# Patient Record
Sex: Female | Born: 1970 | Race: Black or African American | Hispanic: No | Marital: Single | Smoking: Never smoker
Health system: Southern US, Community
[De-identification: ages and names within clinical notes are randomized; demographics above are authoritative.]

## PROBLEM LIST (undated history)

## (undated) DIAGNOSIS — D219 Benign neoplasm of connective and other soft tissue, unspecified: Secondary | ICD-10-CM

## (undated) HISTORY — PX: TUBAL LIGATION: SHX77

---

## 2016-04-24 ENCOUNTER — Ambulatory Visit (HOSPITAL_COMMUNITY)
Admission: EM | Admit: 2016-04-24 | Discharge: 2016-04-24 | Disposition: A | Discharge status: Home / Self Care | Payer: Medicaid Other | Attending: Family Medicine | Admitting: Family Medicine

## 2016-04-24 ENCOUNTER — Encounter (HOSPITAL_COMMUNITY): Payer: Self-pay | Admitting: Emergency Medicine

## 2016-04-24 DIAGNOSIS — B86 Scabies: Secondary | ICD-10-CM | POA: Diagnosis not present

## 2016-04-24 DIAGNOSIS — K589 Irritable bowel syndrome without diarrhea: Secondary | ICD-10-CM | POA: Diagnosis not present

## 2016-04-24 LAB — POCT PREGNANCY, URINE: Preg Test, Ur: NEGATIVE

## 2016-04-24 MED ORDER — DICYCLOMINE HCL 20 MG PO TABS: 20.0000 mg | ORAL_TABLET | Freq: Two times a day (BID) | ORAL | 0 refills | Status: DC

## 2016-04-24 MED ORDER — IVERMECTIN 3 MG PO TABS: ORAL_TABLET | ORAL | 0 refills | Status: DC

## 2016-04-24 NOTE — ED Provider Notes (Signed)
CSN: [id number]     Arrival date & time 04/24/16  1214 History   First MD Initiated Contact with Patient 04/24/16 1438     Chief Complaint  Patient presents with  . Possible Pregnancy  . Head Lice   (Consider location/radiation/quality/duration/timing/severity/associated sxs/prior Treatment) Patient c/o having some bowel spasms or feeling like she did when was pregnant and baby was kicking her.  Patient also c/o being exposed to scabies and lice and feeling something moving in her hair.   The history is provided by the patient. The history is limited by a language barrier.  Possible Pregnancy  This is a new problem. The current episode started yesterday. The problem occurs rarely. The problem has not changed since onset.Associated symptoms include abdominal pain. Nothing aggravates the symptoms. Nothing relieves the symptoms. She has tried nothing for the symptoms.    History reviewed. No pertinent past medical history. Past Surgical History:  Procedure Laterality Date  . CESAREAN SECTION     Family History  Problem Relation Age of Onset  . Hypertension Mother    Social History  Substance Use Topics  . Smoking status: Never Smoker  . Smokeless tobacco: Never Used  . Alcohol use Yes   OB History    No data available     Review of Systems  Constitutional: Negative.   HENT: Negative.   Eyes: Negative.   Respiratory: Negative.   Cardiovascular: Negative.   Gastrointestinal: Positive for abdominal pain.  Endocrine: Negative.   Genitourinary: Negative.   Musculoskeletal: Negative.   Skin: Negative.   Allergic/Immunologic: Negative.   Neurological: Negative.   Hematological: Negative.   Psychiatric/Behavioral: Negative.     Allergies  Review of patient's allergies indicates no known allergies.  Home Medications   Prior to Admission medications   Not on File   Meds Ordered and Administered this Visit  Medications - No data to display  LMP 03/15/2016  No data  found.   Physical Exam  Constitutional: She is oriented to person, place, and time. She appears well-developed and well-nourished.  HENT:  Head: Normocephalic and atraumatic.  Right Ear: External ear normal.  Left Ear: External ear normal.  Mouth/Throat: Oropharynx is clear and moist.  Eyes: Conjunctivae and EOM are normal. Pupils are equal, round, and reactive to light.  Neck: Normal range of motion. Neck supple.  Cardiovascular: Normal rate, regular rhythm and normal heart sounds.   Pulmonary/Chest: Effort normal and breath sounds normal.  Abdominal: Soft. Bowel sounds are normal.  Musculoskeletal: Normal range of motion.  Neurological: She is alert and oriented to person, place, and time.  Skin: Skin is warm and dry.  Psychiatric: She has a normal mood and affect.  Nursing note and vitals reviewed.   Urgent Care Course   Clinical Course    Procedures (including critical care time)  Labs Review Labs Reviewed  POCT PREGNANCY, URINE    Imaging Review No results found.   Visual Acuity Review  Right Eye Distance:   Left Eye Distance:   Bilateral Distance:    Right Eye Near:   Left Eye Near:    Bilateral Near:         MDM  Scabies and lice exposure - ivermectin 3mg  3 po x 1day #3  Bowel spasm - Bentyl 20mg  po bid #20     Lysbeth Penner, FNP 04/24/16 1516

## 2016-04-24 NOTE — ED Notes (Signed)
On discharge patient requested another prescription and requested a work note

## 2016-04-24 NOTE — ED Triage Notes (Signed)
Patient has multiple complaints.  Patient wants a pregnancy test.  Last period was September 1 and is reported as "light". Patient also "feels something moving around"    Patient also mentions feeling things crawling on scalp.  Patient reports she has been in environments where lice and scabies are a problem

## 2016-07-25 ENCOUNTER — Encounter (HOSPITAL_COMMUNITY): Payer: Self-pay | Admitting: Emergency Medicine

## 2016-07-25 ENCOUNTER — Ambulatory Visit (HOSPITAL_COMMUNITY)
Admission: EM | Admit: 2016-07-25 | Discharge: 2016-07-25 | Disposition: A | Discharge status: Home / Self Care | Payer: Medicaid Other | Attending: Emergency Medicine | Admitting: Emergency Medicine

## 2016-07-25 DIAGNOSIS — B86 Scabies: Secondary | ICD-10-CM

## 2016-07-25 MED ORDER — PERMETHRIN 5 % EX CREA: TOPICAL_CREAM | CUTANEOUS | 2 refills | Status: DC

## 2016-07-25 MED ORDER — IVERMECTIN 3 MG PO TABS: ORAL_TABLET | ORAL | 1 refills | Status: DC

## 2016-07-25 NOTE — ED Provider Notes (Signed)
CSN: [id number]     Arrival date & time 07/25/16  1055 History   None    Chief Complaint  Patient presents with  . Rash   (Consider location/radiation/quality/duration/timing/severity/associated sxs/prior Treatment) Patient is here for c/o scabies.     The history is provided by the patient.  Rash  Location:  Full body Onset quality:  Gradual Duration:  2 days Timing:  Constant Chronicity:  New Relieved by:  Nothing Worsened by:  Nothing   History reviewed. No pertinent past medical history. Past Surgical History:  Procedure Laterality Date  . CESAREAN SECTION     Family History  Problem Relation Age of Onset  . Hypertension Mother    Social History  Substance Use Topics  . Smoking status: Never Smoker  . Smokeless tobacco: Never Used  . Alcohol use Yes   OB History    No data available     Review of Systems  Constitutional: Negative.   HENT: Negative.   Eyes: Negative.   Respiratory: Negative.   Cardiovascular: Negative.   Endocrine: Negative.   Genitourinary: Negative.   Musculoskeletal: Negative.   Skin: Positive for rash.  Allergic/Immunologic: Negative.   Hematological: Negative.   Psychiatric/Behavioral: Negative.     Allergies  Patient has no known allergies.  Home Medications   Prior to Admission medications   Medication Sig Start Date End Date Taking? Authorizing Provider  dicyclomine (BENTYL) 20 MG tablet Take 1 tablet (20 mg total) by mouth 2 (two) times daily. 04/24/16   Lysbeth Penner, FNP  ivermectin (STROMECTOL) 3 MG TABS tablet Take 3 po x 1day 07/25/16   Lysbeth Penner, FNP   Meds Ordered and Administered this Visit  Medications - No data to display  BP 106/70 (BP Location: Left Arm)   Pulse 85   Temp 98.2 F (36.8 C) (Oral)   Resp 18   LMP 07/18/2016   SpO2 100%  No data found.   Physical Exam  Constitutional: She appears well-developed and well-nourished.  HENT:  Head: Normocephalic and atraumatic.  Right Ear:  External ear normal.  Left Ear: External ear normal.  Mouth/Throat: Oropharynx is clear and moist.  Eyes: Conjunctivae and EOM are normal. Pupils are equal, round, and reactive to light.  Neck: Normal range of motion. Neck supple.  Cardiovascular: Normal rate, regular rhythm and normal heart sounds.   Pulmonary/Chest: Effort normal and breath sounds normal.  Abdominal: Soft. Bowel sounds are normal.  Nursing note and vitals reviewed.   Urgent Care Course   Clinical Course     Procedures (including critical care time)  Labs Review Labs Reviewed - No data to display  Imaging Review No results found.   Visual Acuity Review  Right Eye Distance:   Left Eye Distance:   Bilateral Distance:    Right Eye Near:   Left Eye Near:    Bilateral Near:         MDM   1. Scabies    Ivermectin  Permethrin cream      Lysbeth Penner, FNP 07/25/16 [place], FNP 07/25/16 1225

## 2016-07-25 NOTE — ED Triage Notes (Signed)
Here for persistent rash/scabies all over body onset 1 month  Seen here in 10/17 and was treated for scabies  Denies fevers, chills  A&O x4... NAD

## 2016-08-28 ENCOUNTER — Encounter (HOSPITAL_COMMUNITY): Payer: Self-pay | Admitting: Emergency Medicine

## 2016-08-28 ENCOUNTER — Ambulatory Visit (HOSPITAL_COMMUNITY)
Admission: EM | Admit: 2016-08-28 | Discharge: 2016-08-28 | Disposition: A | Discharge status: Home / Self Care | Payer: Medicaid Other | Attending: Internal Medicine | Admitting: Internal Medicine

## 2016-08-28 DIAGNOSIS — H9201 Otalgia, right ear: Secondary | ICD-10-CM | POA: Diagnosis not present

## 2016-08-28 DIAGNOSIS — H6501 Acute serous otitis media, right ear: Secondary | ICD-10-CM | POA: Diagnosis not present

## 2016-08-28 DIAGNOSIS — L309 Dermatitis, unspecified: Secondary | ICD-10-CM

## 2016-08-28 DIAGNOSIS — Z8619 Personal history of other infectious and parasitic diseases: Secondary | ICD-10-CM

## 2016-08-28 LAB — POCT PREGNANCY, URINE: Preg Test, Ur: NEGATIVE

## 2016-08-28 MED ORDER — FLUOCINOLONE ACETONIDE BODY 0.01 % EX OIL: TOPICAL_OIL | CUTANEOUS | 0 refills | Status: AC

## 2016-08-28 MED ORDER — AMOXICILLIN 500 MG PO CAPS: 1000.0000 mg | ORAL_CAPSULE | Freq: Two times a day (BID) | ORAL | 0 refills | Status: DC

## 2016-08-28 NOTE — ED Provider Notes (Signed)
CSN: [id number]     Arrival date & time 08/28/16  26 History   First MD Initiated Contact with Patient 08/28/16 1141     Chief Complaint  Patient presents with  . Otalgia   (Consider location/radiation/quality/duration/timing/severity/associated sxs/prior Treatment) 46 year old female complaining of right earache. This started just a few days ago. She states she feels scabies crawling in her ear. 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 She is requesting oral medication for this. He states that the right earache his chief complaint. When asked to show areas of infestation or rash from scabies she is unable to do so. Searching the skin, eyebrows and scalp there is no evidence of infection of firm and of any kind. No rash. She is also requesting Derma-Smoothe for her dry skin. She states that her PCP in [place] is no longer in practice.      History reviewed. No pertinent past medical history. Past Surgical History:  Procedure Laterality Date  . CESAREAN SECTION     Family History  Problem Relation Age of Onset  . Hypertension Mother    Social History  Substance Use Topics  . Smoking status: Never Smoker  . Smokeless tobacco: Never Used  . Alcohol use Yes   OB History    No data available     Review of Systems  HENT: Positive for ear pain.   Eyes: Negative.   Respiratory: Negative.   Gastrointestinal: Negative.   Genitourinary: Negative.   Skin: Negative for rash.  Neurological: Negative.   All other systems reviewed and are negative.   Allergies  Patient has no known allergies.  Home Medications   Prior to Admission medications   Medication Sig Start Date End Date Taking? Authorizing Provider  amoxicillin (AMOXIL) 500 MG capsule Take 2 capsules (1,000 mg total) by mouth 2 (two) times daily. 08/28/16   Janne Napoleon, NP  Fluocinolone Acetonide Body  (DERMA-SMOOTHE/FS BODY) 0.01 % OIL Use once daily as directed 08/28/16   Janne Napoleon, NP   Meds Ordered and Administered this Visit  Medications - No data to display  BP (!) 115/54 (BP Location: Right Arm)   Pulse 68   Temp 97.8 F (36.6 C) (Oral)   Resp 16   SpO2 100%  No data found.   Physical Exam  Constitutional: She is oriented to person, place, and time. She appears well-developed and well-nourished. No distress.  HENT:  Head: Normocephalic and atraumatic.  Mouth/Throat: No oropharyngeal exudate.  Superficial minor crusting to the outer ear. Note the patient states this is normal for her and chronic. Examination of the EAC reveals no skin lesions, burrowing, Berman or other pathology. The EAC is clear. The TM does have some bulging and opaque fluid in the middle ear.  Eyes: EOM are normal.  Neck: Normal range of motion. Neck supple.  Cardiovascular: Normal rate.   Pulmonary/Chest: Effort normal. No respiratory distress.  Musculoskeletal: She exhibits no edema.  Neurological: She is alert and oriented to person, place, and time. She exhibits normal muscle tone.  Skin: Skin is warm and dry. No rash noted. She is not diaphoretic.  Psychiatric: She has a normal mood and affect.  Nursing note and vitals reviewed.   Urgent Care Course     Procedures (including critical care time)  Labs Review Labs Reviewed  POCT PREGNANCY, URINE    Imaging Review No results found.  Visual Acuity Review  Right Eye Distance:   Left Eye Distance:   Bilateral Distance:    Right Eye Near:   Left Eye Near:    Bilateral Near:         MDM   1. Right acute serous otitis media, recurrence not specified   2. Right ear pain   3. History of scabies   4. Dermatitis    Take the medication as directed. On the  first stage there is listed a resource to call to obtain a primary care doctor. Written in your instructions and told [facility] there are 2 telephone numbers to call they can  help you obtain a primary care provider. Meds ordered this encounter  Medications  . Fluocinolone Acetonide Body (DERMA-SMOOTHE/FS BODY) 0.01 % OIL    Sig: Use once daily as directed    Dispense:  120 mL    Refill:  0    Order Specific Question:   Supervising Provider    Answer:   Sherlene Shams [id number]  . amoxicillin (AMOXIL) 500 MG capsule    Sig: Take 2 capsules (1,000 mg total) by mouth 2 (two) times daily.    Dispense:  40 capsule    Refill:  0    Order Specific Question:   Supervising Provider    Answer:   Sherlene Shams [id number]       Janne Napoleon, NP 08/28/16 1209

## 2016-08-28 NOTE — ED Triage Notes (Signed)
The patient presented to the [facility] with a complaint of right ear pain x 2 days.

## 2016-08-28 NOTE — Discharge Instructions (Signed)
Take the medication as directed. On the  first stage there is listed a resource to call to obtain a primary care doctor. Written in your instructions and told [facility] [place] there are 2 telephone numbers to call they can help you obtain a primary care provider.

## 2016-08-28 NOTE — ED Notes (Signed)
Patient requested pregnancy test, Jacqueline mabe, np agreeable to order.  Specimen in lab

## 2016-09-13 ENCOUNTER — Ambulatory Visit (HOSPITAL_COMMUNITY)
Admission: EM | Admit: 2016-09-13 | Discharge: 2016-09-13 | Disposition: A | Discharge status: Home / Self Care | Payer: Medicaid Other | Attending: Family Medicine | Admitting: Family Medicine

## 2016-09-13 ENCOUNTER — Encounter (HOSPITAL_COMMUNITY): Payer: Self-pay | Admitting: Emergency Medicine

## 2016-09-13 DIAGNOSIS — R05 Cough: Secondary | ICD-10-CM | POA: Diagnosis not present

## 2016-09-13 DIAGNOSIS — R059 Cough, unspecified: Secondary | ICD-10-CM

## 2016-09-13 DIAGNOSIS — J40 Bronchitis, not specified as acute or chronic: Secondary | ICD-10-CM

## 2016-09-13 MED ORDER — BENZONATATE 100 MG PO CAPS: 100.0000 mg | ORAL_CAPSULE | Freq: Three times a day (TID) | ORAL | 0 refills | Status: DC

## 2016-09-13 MED ORDER — IPRATROPIUM BROMIDE 0.06 % NA SOLN: 2.0000 | Freq: Four times a day (QID) | NASAL | 0 refills | Status: AC

## 2016-09-13 MED ORDER — AZITHROMYCIN 250 MG PO TABS: 250.0000 mg | ORAL_TABLET | Freq: Every day | ORAL | 0 refills | Status: DC

## 2016-09-13 NOTE — ED Notes (Signed)
Called x1. No answer.

## 2016-09-13 NOTE — ED Triage Notes (Signed)
Pt c/o cold sx onset: 2 days  Sx include: dry cough, nasal drainage, SOB, fevers  A&O x4... NAD

## 2016-09-13 NOTE — ED Provider Notes (Signed)
CSN: [id number]     Arrival date & time 09/13/16  1318 History   None    Chief Complaint  Patient presents with  . URI   (Consider location/radiation/quality/duration/timing/severity/associated sxs/prior Treatment) Patient c/o cough and uri sx's for a week.   The history is provided by the patient.  URI  Presenting symptoms: congestion, cough, fatigue and rhinorrhea   Severity:  Moderate Onset quality:  Sudden Duration:  1 week Timing:  Constant Progression:  Worsening Chronicity:  New Relieved by:  Nothing Worsened by:  Nothing Ineffective treatments:  None tried   History reviewed. No pertinent past medical history. Past Surgical History:  Procedure Laterality Date  . CESAREAN SECTION     Family History  Problem Relation Age of Onset  . Hypertension Mother    Social History  Substance Use Topics  . Smoking status: Never Smoker  . Smokeless tobacco: Never Used  . Alcohol use Yes   OB History    No data available     Review of Systems  Constitutional: Positive for fatigue.  HENT: Positive for congestion and rhinorrhea.   Eyes: Negative.   Respiratory: Positive for cough.   Cardiovascular: Negative.   Gastrointestinal: Negative.   Endocrine: Negative.   Genitourinary: Negative.   Musculoskeletal: Negative.   Allergic/Immunologic: Negative.   Neurological: Negative.   Hematological: Negative.   Psychiatric/Behavioral: Negative.     Allergies  Patient has no known allergies.  Home Medications   Prior to Admission medications   Medication Sig Start Date End Date Taking? Authorizing Provider  amoxicillin (AMOXIL) 500 MG capsule Take 2 capsules (1,000 mg total) by mouth 2 (two) times daily. 08/28/16   Janne Napoleon, NP  azithromycin (ZITHROMAX) 250 MG tablet Take 1 tablet (250 mg total) by mouth daily. Take first 2 tablets together, then 1 every day until finished. 09/13/16   Lysbeth Penner, FNP  benzonatate (TESSALON) 100 MG capsule Take 1 capsule (100 mg  total) by mouth every 8 (eight) hours. 09/13/16   Lysbeth Penner, FNP  Fluocinolone Acetonide Body (DERMA-SMOOTHE/FS BODY) 0.01 % OIL Use once daily as directed 08/28/16   Janne Napoleon, NP  ipratropium (ATROVENT) 0.06 % nasal spray Place 2 sprays into both nostrils 4 (four) times daily. 09/13/16   Lysbeth Penner, FNP   Meds Ordered and Administered this Visit  Medications - No data to display  BP 117/81 (BP Location: Left Arm)   Pulse 68   Temp 97.5 F (36.4 C) (Oral)   Resp 20   LMP 09/13/2016   SpO2 100%  No data found.   Physical Exam  Constitutional: She appears well-developed and well-nourished.  HENT:  Head: Normocephalic and atraumatic.  Right Ear: External ear normal.  Left Ear: External ear normal.  Mouth/Throat: Oropharynx is clear and moist.  Eyes: Conjunctivae and EOM are normal. Pupils are equal, round, and reactive to light.  Neck: Normal range of motion. Neck supple.  Cardiovascular: Normal rate, regular rhythm and normal heart sounds.   Pulmonary/Chest: Effort normal and breath sounds normal.  Abdominal: Soft. Bowel sounds are normal.  Nursing note and vitals reviewed.   Urgent Care Course     Procedures (including critical care time)  Labs Review Labs Reviewed - No data to display  Imaging Review No results found.   Visual Acuity Review  Right Eye Distance:   Left Eye Distance:   Bilateral Distance:    Right Eye Near:   Left Eye Near:    Bilateral Near:  MDM   1. Bronchitis   2. Cough    Zpak as directed Tessalon perles Atrovent Nasal Spray  Push po fluids, rest, tylenol and motrin otc prn as directed for fever, arthralgias, and myalgias.  Follow up prn if sx's continue or persist.    Lysbeth Penner, FNP 09/13/16 1408

## 2016-11-12 ENCOUNTER — Encounter (HOSPITAL_COMMUNITY): Payer: Self-pay | Admitting: Emergency Medicine

## 2016-11-12 ENCOUNTER — Emergency Department (HOSPITAL_COMMUNITY)
Admission: EM | Admit: 2016-11-12 | Discharge: 2016-11-13 | Disposition: A | Discharge status: Home / Self Care | Payer: Medicaid Other | Attending: Emergency Medicine | Admitting: Emergency Medicine

## 2016-11-12 ENCOUNTER — Emergency Department (HOSPITAL_COMMUNITY): Payer: Medicaid Other

## 2016-11-12 DIAGNOSIS — R0789 Other chest pain: Secondary | ICD-10-CM | POA: Insufficient documentation

## 2016-11-12 DIAGNOSIS — K2970 Gastritis, unspecified, without bleeding: Secondary | ICD-10-CM | POA: Diagnosis not present

## 2016-11-12 DIAGNOSIS — R079 Chest pain, unspecified: Secondary | ICD-10-CM

## 2016-11-12 LAB — I-STAT TROPONIN, ED: TROPONIN I, POC: 0.01 ng/mL (ref 0.00–0.08)

## 2016-11-12 LAB — CBC
HEMATOCRIT: 32.5 % — AB (ref 36.0–46.0)
Hemoglobin: 10.2 g/dL — ABNORMAL LOW (ref 12.0–15.0)
MCH: 25.4 pg — ABNORMAL LOW (ref 26.0–34.0)
MCHC: 31.4 g/dL (ref 30.0–36.0)
MCV: 80.8 fL (ref 78.0–100.0)
PLATELETS: 288 10*3/uL (ref 150–400)
RBC: 4.02 MIL/uL (ref 3.87–5.11)
RDW: 15.4 % (ref 11.5–15.5)
WBC: 5.4 10*3/uL (ref 4.0–10.5)

## 2016-11-12 LAB — LIPASE, BLOOD: LIPASE: 25 U/L (ref 11–51)

## 2016-11-12 MED ORDER — KETOROLAC TROMETHAMINE 30 MG/ML IJ SOLN: 30.0000 mg | Freq: Once | INTRAMUSCULAR | Status: DC

## 2016-11-12 NOTE — ED Provider Notes (Signed)
[facility] DEPT Provider Note   CSN: [id number] Arrival date & time: 11/12/16  2213     History   Chief Complaint Chief Complaint  Patient presents with  . L Ribcage pain  . Heart flutter    HPI Jacqueline Richards is a 46 y.o. female.  HPI 46 y.o. female, presents to the Emergency Department today complaining of left sided chest discomfort x 1 hour ago. Pt states it feels like a pressure sensation underneath her left rib cage. Notes pain 7/10. No nausea without emesis. No shortness of breath. No diaphoresis. No pain with inspiration. Notes ability to tolerate PO without difficulty, but causes some discomfort. Notes no meds PTA. No hx ACS. No FH. No Hx DVT/PE. No recent travel. No recent surgeries. No other symptoms noted.   History reviewed. No pertinent past medical history.  There are no active problems to display for this patient.   Past Surgical History:  Procedure Laterality Date  . CESAREAN SECTION      OB History    No data available       Home Medications    Prior to Admission medications   Medication Sig Start Date End Date Taking? Authorizing Provider  amoxicillin (AMOXIL) 500 MG capsule Take 2 capsules (1,000 mg total) by mouth 2 (two) times daily. 08/28/16   Janne Napoleon, NP  azithromycin (ZITHROMAX) 250 MG tablet Take 1 tablet (250 mg total) by mouth daily. Take first 2 tablets together, then 1 every day until finished. 09/13/16   Lysbeth Penner, FNP  benzonatate (TESSALON) 100 MG capsule Take 1 capsule (100 mg total) by mouth every 8 (eight) hours. 09/13/16   Lysbeth Penner, FNP  Fluocinolone Acetonide Body (DERMA-SMOOTHE/FS BODY) 0.01 % OIL Use once daily as directed 08/28/16   Janne Napoleon, NP  ipratropium (ATROVENT) 0.06 % nasal spray Place 2 sprays into both nostrils 4 (four) times daily. 09/13/16   Lysbeth Penner, FNP    Family History Family History  Problem Relation Age of Onset  . Hypertension Mother     Social History Social History    Substance Use Topics  . Smoking status: Never Smoker  . Smokeless tobacco: Never Used  . Alcohol use Yes     Allergies   Patient has no known allergies.   Review of Systems Review of Systems ROS reviewed and all are negative for acute change except as noted in the HPI.  Physical Exam Updated Vital Signs BP 121/71   Pulse 79   Temp 98.7 F (37.1 C) (Oral)   Ht 5\' 2"  (1.575 m)   Wt 59 kg   LMP 11/08/2016 (Approximate)   SpO2 100%   BMI 23.78 kg/m   Physical Exam  Constitutional: She is oriented to person, place, and time. Vital signs are normal. She appears well-developed and well-nourished.  HENT:  Head: Normocephalic and atraumatic.  Right Ear: Hearing normal.  Left Ear: Hearing normal.  Eyes: Conjunctivae and EOM are normal. Pupils are equal, round, and reactive to light.  Neck: Normal range of motion. Neck supple.  Cardiovascular: Normal rate, regular rhythm, normal heart sounds and intact distal pulses.   Pulmonary/Chest: Effort normal and breath sounds normal. No respiratory distress. She has no wheezes. She has no rales.  Abdominal: Soft. Normal appearance and bowel sounds are normal. There is tenderness in the left upper quadrant.  Musculoskeletal: Normal range of motion.  Neurological: She is alert and oriented to person, place, and time.  Skin: Skin is warm and dry.  Psychiatric: She has a normal mood and affect. Her speech is normal and behavior is normal. Thought content normal.  Nursing note and vitals reviewed.  ED Treatments / Results  Labs (all labs ordered are listed, but only abnormal results are displayed) Labs Reviewed  CBC - Abnormal; Notable for the following:       Result Value   Hemoglobin 10.2 (*)    HCT 32.5 (*)    MCH 25.4 (*)    All other components within normal limits  COMPREHENSIVE METABOLIC PANEL - Abnormal; Notable for the following:    Sodium 132 (*)    ALT 8 (*)    Total Bilirubin 0.2 (*)    Anion gap 4 (*)    All other  components within normal limits  LIPASE, BLOOD  I-STAT TROPOININ, ED    EKG  EKG Interpretation None       Radiology Dg Chest 2 View  Result Date: 11/12/2016 CLINICAL DATA:  Dyspnea, cough x2 weeks and palpitations EXAM: CHEST  2 VIEW COMPARISON:  None. FINDINGS: The heart size and mediastinal contours are within normal limits. Both lungs are clear. The visualized skeletal structures are unremarkable. IMPRESSION: No active cardiopulmonary disease. Electronically Signed   By: Ashley Royalty M.D.   On: 11/12/2016 23:53    Procedures Procedures (including critical care time)  Medications Ordered in ED Medications  ketorolac (TORADOL) 30 MG/ML injection 30 mg (not administered)     Initial Impression / Assessment and Plan / ED Course  I have reviewed the triage vital signs and the nursing notes.  Pertinent labs & imaging results that were available during my care of the patient were reviewed by me and considered in my medical decision making (see chart for details).  Final Clinical Impressions(s) / ED Diagnoses  {I have reviewed and evaluated the relevant laboratory values. {I have reviewed and evaluated the relevant imaging studies. {I have interpreted the relevant EKG. {I have reviewed the relevant previous healthcare records.  {I obtained HPI from historian.   ED Course:  Assessment: Pt is a 46 y.o. female presents with CP PTA. Notes nausea. No SOB. No emesis. NO diaphoresis. Notes pain underneath left rib cage in LUQ. Worsens with PO intake and certain positions. Risk Factors for ACS: None. Given Toradol in ED. Patient is to be discharged with recommendation to follow up with PCP in regards to today's hospital visit. Chest pain is not likely of cardiac or pulmonary etiology d/t presentation, perc negative, VSS, no tracheal deviation, no JVD or new murmur, RRR, breath sounds equal bilaterally, EKG without acute abnormalities, negative troponin, and negative CXR. Heart Score 0. Pt has  been advised start a PPI and return to the ED is CP becomes exertional, associated with diaphoresis or nausea, radiates to left jaw/arm, worsens or becomes concerning in any way. Pt appears reliable for follow up and is agreeable to discharge. Patient is in no acute distress. Vital Signs are stable. Patient is able to ambulate. Patient able to tolerate PO.   Disposition/Plan:  DC Home Additional Verbal discharge instructions given and discussed with patient.  Pt Instructed to f/u with PCP in the next week for evaluation and treatment of symptoms. Return precautions given Pt acknowledges and agrees with plan  Supervising Physician Duffy Bruce, MD  Final diagnoses:  Chest pain, unspecified type  Gastritis without bleeding, unspecified chronicity, unspecified gastritis type    New Prescriptions New Prescriptions   No medications on file     Shary Decamp, PA-C  11/13/16 0052    April Palumbo, MD 11/13/16 0300

## 2016-11-13 LAB — COMPREHENSIVE METABOLIC PANEL
ALT: 8 U/L — AB (ref 14–54)
ANION GAP: 4 — AB (ref 5–15)
AST: 16 U/L (ref 15–41)
Albumin: 3.5 g/dL (ref 3.5–5.0)
Alkaline Phosphatase: 47 U/L (ref 38–126)
BUN: 10 mg/dL (ref 6–20)
CHLORIDE: 103 mmol/L (ref 101–111)
CO2: 25 mmol/L (ref 22–32)
CREATININE: 0.79 mg/dL (ref 0.44–1.00)
Calcium: 10 mg/dL (ref 8.9–10.3)
Glucose, Bld: 94 mg/dL (ref 65–99)
POTASSIUM: 3.5 mmol/L (ref 3.5–5.1)
SODIUM: 132 mmol/L — AB (ref 135–145)
Total Bilirubin: 0.2 mg/dL — ABNORMAL LOW (ref 0.3–1.2)
Total Protein: 6.8 g/dL (ref 6.5–8.1)

## 2016-11-13 MED ORDER — PANTOPRAZOLE SODIUM 20 MG PO TBEC: 20.0000 mg | DELAYED_RELEASE_TABLET | Freq: Every day | ORAL | 0 refills | Status: AC

## 2016-11-13 MED ORDER — GI COCKTAIL ~~LOC~~
30.0000 mL | Freq: Once | ORAL | Status: AC
Start: 2016-11-13 — End: 2016-11-13
  Administered 2016-11-13: 30 mL via ORAL
  Filled 2016-11-13: qty 30

## 2016-11-13 MED ORDER — IBUPROFEN 400 MG PO TABS
600.0000 mg | ORAL_TABLET | Freq: Once | ORAL | Status: AC
  Administered 2016-11-13: 600 mg via ORAL
  Filled 2016-11-13: qty 1

## 2016-11-13 NOTE — ED Notes (Signed)
Pt given Sprite and sandwich per EDP.

## 2016-11-13 NOTE — ED Notes (Signed)
E-signature not available. Pt states that she understands dc instructions.

## 2016-11-13 NOTE — Discharge Instructions (Signed)
Please read and follow all provided instructions.  Your diagnoses today include:  1. Chest pain, unspecified type   2. Gastritis without bleeding, unspecified chronicity, unspecified gastritis type     Tests performed today include: An EKG of your heart A chest x-ray Cardiac enzymes - a blood test for heart muscle damage Blood counts and electrolytes Vital signs. See below for your results today.   Medications prescribed:   Take any prescribed medications only as directed.  Follow-up instructions: Please follow-up with your primary care provider as soon as you can for further evaluation of your symptoms.   Return instructions:  SEEK IMMEDIATE MEDICAL ATTENTION IF: You have severe chest pain, especially if the pain is crushing or pressure-like and spreads to the arms, back, neck, or jaw, or if you have sweating, nausea (feeling sick to your stomach), or shortness of breath. THIS IS AN EMERGENCY. Don't wait to see if the pain will go away. Get medical help at once. Call 911 or 0 (operator). DO NOT drive yourself to the hospital.  Your chest pain gets worse and does not go away with rest.  You have an attack of chest pain lasting longer than usual, despite rest and treatment with the medications your caregiver has prescribed.  You wake from sleep with chest pain or shortness of breath. You feel dizzy or faint. You have chest pain not typical of your usual pain for which you originally saw your caregiver.  You have any other emergent concerns regarding your health.  Additional Information: Chest pain comes from many different causes. Your caregiver has diagnosed you as having chest pain that is not specific for one problem, but does not require admission.  You are at low risk for an acute heart condition or other serious illness.   Your vital signs today were: BP 129/79    Pulse 77    Temp 98.7 F (37.1 C) (Oral)    Resp 14    Ht 5\' 2"  (1.575 m)    Wt 59 kg    LMP 11/08/2016  (Approximate)    SpO2 100%    BMI 23.78 kg/m  If your blood pressure (BP) was elevated above 135/85 this visit, please have this repeated by your doctor within one month. --------------

## 2017-03-23 ENCOUNTER — Encounter (HOSPITAL_COMMUNITY): Payer: Self-pay | Admitting: Emergency Medicine

## 2017-03-23 DIAGNOSIS — Z79899 Other long term (current) drug therapy: Secondary | ICD-10-CM | POA: Insufficient documentation

## 2017-03-23 DIAGNOSIS — N939 Abnormal uterine and vaginal bleeding, unspecified: Secondary | ICD-10-CM | POA: Insufficient documentation

## 2017-03-23 DIAGNOSIS — R102 Pelvic and perineal pain: Secondary | ICD-10-CM | POA: Diagnosis present

## 2017-03-23 LAB — COMPREHENSIVE METABOLIC PANEL
ALT: 7 U/L — ABNORMAL LOW (ref 14–54)
ANION GAP: 6 (ref 5–15)
AST: 16 U/L (ref 15–41)
Albumin: 3.4 g/dL — ABNORMAL LOW (ref 3.5–5.0)
Alkaline Phosphatase: 55 U/L (ref 38–126)
BILIRUBIN TOTAL: 0.4 mg/dL (ref 0.3–1.2)
BUN: 7 mg/dL (ref 6–20)
CO2: 22 mmol/L (ref 22–32)
Calcium: 9.6 mg/dL (ref 8.9–10.3)
Chloride: 110 mmol/L (ref 101–111)
Creatinine, Ser: 0.77 mg/dL (ref 0.44–1.00)
GLUCOSE: 107 mg/dL — AB (ref 65–99)
POTASSIUM: 3.6 mmol/L (ref 3.5–5.1)
Sodium: 138 mmol/L (ref 135–145)
Total Protein: 7.3 g/dL (ref 6.5–8.1)

## 2017-03-23 LAB — URINALYSIS, ROUTINE W REFLEX MICROSCOPIC
BACTERIA UA: NONE SEEN
BILIRUBIN URINE: NEGATIVE
Glucose, UA: NEGATIVE mg/dL
Ketones, ur: NEGATIVE mg/dL
Nitrite: NEGATIVE
Protein, ur: 100 mg/dL — AB
SPECIFIC GRAVITY, URINE: 1.018 (ref 1.005–1.030)
pH: 5 (ref 5.0–8.0)

## 2017-03-23 LAB — CBC
HEMATOCRIT: 32.6 % — AB (ref 36.0–46.0)
Hemoglobin: 10.2 g/dL — ABNORMAL LOW (ref 12.0–15.0)
MCH: 24.5 pg — ABNORMAL LOW (ref 26.0–34.0)
MCHC: 31.3 g/dL (ref 30.0–36.0)
MCV: 78.4 fL (ref 78.0–100.0)
Platelets: 303 10*3/uL (ref 150–400)
RBC: 4.16 MIL/uL (ref 3.87–5.11)
RDW: 15.3 % (ref 11.5–15.5)
WBC: 4.7 10*3/uL (ref 4.0–10.5)

## 2017-03-23 LAB — LIPASE, BLOOD: Lipase: 29 U/L (ref 11–51)

## 2017-03-23 NOTE — ED Triage Notes (Signed)
Reports having two tampons in vagina that she can not get out.  Currently on period so unsure about any discharge.  Endorses pelvic pain.  States I think I have toxic shock syndrome.  Tampons in for two days.

## 2017-03-24 ENCOUNTER — Emergency Department (HOSPITAL_COMMUNITY)
Admission: EM | Admit: 2017-03-24 | Discharge: 2017-03-24 | Disposition: A | Discharge status: Home / Self Care | Payer: Medicaid Other | Attending: Emergency Medicine | Admitting: Emergency Medicine

## 2017-03-24 DIAGNOSIS — R102 Pelvic and perineal pain: Secondary | ICD-10-CM

## 2017-03-24 NOTE — ED Provider Notes (Signed)
[facility] DEPT Provider Note   CSN: [id number] Arrival date & time: 03/23/17  2054     History   Chief Complaint Chief Complaint  Patient presents with  . Pelvic Pain    HPI Jacqueline Richards is a 46 y.o. female.  Patient presents to the emergency department for evaluation of pelvic cramping and possible retained tampon. Patient reports that she inserted a tampon Saturday, could not find the string and thought it fell out. She put a second tampon in some time during the day, has gone back to look for the second tampon cannot find the string for that one either. She thinks there might be 2 tampons in her vagina. She is concerned because she had a family member that Shock syndrome. She has not had any fever, nausea, vomiting, rash. She does report pelvic cramping which is unusual with her period.      History reviewed. No pertinent past medical history.  There are no active problems to display for this patient.   Past Surgical History:  Procedure Laterality Date  . CESAREAN SECTION      OB History    No data available       Home Medications    Prior to Admission medications   Medication Sig Start Date End Date Taking? Authorizing Provider  amoxicillin (AMOXIL) 500 MG capsule Take 2 capsules (1,000 mg total) by mouth 2 (two) times daily. 08/28/16   Janne Napoleon, NP  azithromycin (ZITHROMAX) 250 MG tablet Take 1 tablet (250 mg total) by mouth daily. Take first 2 tablets together, then 1 every day until finished. 09/13/16   Lysbeth Penner, FNP  benzonatate (TESSALON) 100 MG capsule Take 1 capsule (100 mg total) by mouth every 8 (eight) hours. 09/13/16   Lysbeth Penner, FNP  Fluocinolone Acetonide Body (DERMA-SMOOTHE/FS BODY) 0.01 % OIL Use once daily as directed 08/28/16   Janne Napoleon, NP  ipratropium (ATROVENT) 0.06 % nasal spray Place 2 sprays into both nostrils 4 (four) times daily. 09/13/16   Lysbeth Penner, FNP  pantoprazole (PROTONIX) 20 MG tablet Take 1 tablet  (20 mg total) by mouth daily. 11/13/16   Shary Decamp, PA-C    Family History Family History  Problem Relation Age of Onset  . Hypertension Mother     Social History Social History  Substance Use Topics  . Smoking status: Never Smoker  . Smokeless tobacco: Never Used  . Alcohol use Yes     Allergies   Patient has no known allergies.   Review of Systems Review of Systems  Genitourinary: Positive for pelvic pain and vaginal bleeding.  All other systems reviewed and are negative.    Physical Exam Updated Vital Signs BP 119/70   Pulse 60   Temp 98.2 F (36.8 C) (Oral)   Resp 18   Ht 5\' 2"  (1.575 m)   Wt 64.9 kg (143 lb)   LMP 03/23/2017 (Exact Date)   SpO2 100%   BMI 26.16 kg/m   Physical Exam  Constitutional: She is oriented to person, place, and time. She appears well-developed and well-nourished. No distress.  HENT:  Head: Normocephalic and atraumatic.  Right Ear: Hearing normal.  Left Ear: Hearing normal.  Nose: Nose normal.  Mouth/Throat: Oropharynx is clear and moist and mucous membranes are normal.  Eyes: Pupils are equal, round, and reactive to light. Conjunctivae and EOM are normal.  Neck: Normal range of motion. Neck supple.  Cardiovascular: Regular rhythm, S1 normal and S2 normal.  Exam reveals no  gallop and no friction rub.   No murmur heard. Pulmonary/Chest: Effort normal and breath sounds normal. No respiratory distress. She exhibits no tenderness.  Abdominal: Soft. Normal appearance and bowel sounds are normal. There is no hepatosplenomegaly. There is no tenderness. There is no rebound, no guarding, no tenderness at McBurney's point and negative Murphy's sign. No hernia.  Genitourinary: Vagina normal and uterus normal. Cervix exhibits no motion tenderness, no discharge and no friability. Right adnexum displays no mass and no tenderness. Left adnexum displays no mass and no tenderness.  Musculoskeletal: Normal range of motion.  Neurological: She is  alert and oriented to person, place, and time. She has normal strength. No cranial nerve deficit or sensory deficit. Coordination normal. GCS eye subscore is 4. GCS verbal subscore is 5. GCS motor subscore is 6.  Skin: Skin is warm, dry and intact. No rash noted. No cyanosis.  Psychiatric: She has a normal mood and affect. Her speech is normal and behavior is normal. Thought content normal.  Nursing note and vitals reviewed.    ED Treatments / Results  Labs (all labs ordered are listed, but only abnormal results are displayed) Labs Reviewed  COMPREHENSIVE METABOLIC PANEL - Abnormal; Notable for the following:       Result Value   Glucose, Bld 107 (*)    Albumin 3.4 (*)    ALT 7 (*)    All other components within normal limits  CBC - Abnormal; Notable for the following:    Hemoglobin 10.2 (*)    HCT 32.6 (*)    MCH 24.5 (*)    All other components within normal limits  URINALYSIS, ROUTINE W REFLEX MICROSCOPIC - Abnormal; Notable for the following:    APPearance CLOUDY (*)    Hgb urine dipstick LARGE (*)    Protein, ur 100 (*)    Leukocytes, UA TRACE (*)    Squamous Epithelial / LPF 0-5 (*)    All other components within normal limits  LIPASE, BLOOD    EKG  EKG Interpretation None       Radiology No results found.  Procedures Procedures (including critical care time)  Medications Ordered in ED Medications - No data to display   Initial Impression / Assessment and Plan / ED Course  I have reviewed the triage vital signs and the nursing notes.  Pertinent labs & imaging results that were available during my care of the patient were reviewed by me and considered in my medical decision making (see chart for details).     Patient presents with concern over possible retained tampon. Pelvic exam, however, does not reveal any foreign bodies. Full 360 visualization around the cervix and in the vaginal fornix does not reveal any tampons. Patient reassured.  Final  Clinical Impressions(s) / ED Diagnoses   Final diagnoses:  Pelvic pain in female    New Prescriptions New Prescriptions   No medications on file     Orpah Greek, MD 03/24/17 [phone]

## 2017-03-24 NOTE — ED Notes (Signed)
Pt reports inserting a tampon on Saturday but was unable to see a string after a few hours and states "I thought it must have just fallen out and into the toilet because I couldn't find it." Pt reports she then inserted a second tampon and was then unable to remove the second tampons because "I couldn't find that string either!." Pt now endorses lower abd pain, denies vaginal discharge. Denies fever, diaphoresis, or vomiting.

## 2017-03-24 NOTE — ED Notes (Signed)
EDP at the bedside to perform pelvic exam accompanied by this RN

## 2017-03-29 ENCOUNTER — Encounter (HOSPITAL_COMMUNITY): Payer: Self-pay | Admitting: Emergency Medicine

## 2017-03-29 ENCOUNTER — Ambulatory Visit (HOSPITAL_COMMUNITY)
Admission: EM | Admit: 2017-03-29 | Discharge: 2017-03-29 | Disposition: A | Discharge status: Home / Self Care | Payer: Medicaid Other | Attending: Family Medicine | Admitting: Family Medicine

## 2017-03-29 ENCOUNTER — Ambulatory Visit (HOSPITAL_COMMUNITY)
Admission: EM | Admit: 2017-03-29 | Discharge: 2017-03-29 | Discharge status: Against Medical Advice | Payer: Medicaid Other

## 2017-03-29 DIAGNOSIS — Z3202 Encounter for pregnancy test, result negative: Secondary | ICD-10-CM

## 2017-03-29 DIAGNOSIS — N898 Other specified noninflammatory disorders of vagina: Secondary | ICD-10-CM

## 2017-03-29 DIAGNOSIS — N760 Acute vaginitis: Secondary | ICD-10-CM | POA: Diagnosis not present

## 2017-03-29 LAB — POCT URINALYSIS DIP (DEVICE)
Glucose, UA: NEGATIVE mg/dL
HGB URINE DIPSTICK: NEGATIVE
KETONES UR: NEGATIVE mg/dL
LEUKOCYTES UA: NEGATIVE
NITRITE: NEGATIVE
PH: 6 (ref 5.0–8.0)
Protein, ur: 30 mg/dL — AB
Specific Gravity, Urine: >=1.03 (ref 1.005–1.030)
Urobilinogen, UA: 0.2 mg/dL (ref 0.0–1.0)

## 2017-03-29 LAB — POCT PREGNANCY, URINE: Preg Test, Ur: NEGATIVE

## 2017-03-29 MED ORDER — FLUCONAZOLE 150 MG PO TABS: 150.0000 mg | ORAL_TABLET | Freq: Once | ORAL | 5 refills | Status: AC

## 2017-03-29 MED ORDER — METRONIDAZOLE 0.75 % VA GEL: 1.0000 | Freq: Two times a day (BID) | VAGINAL | 5 refills | Status: AC

## 2017-03-29 NOTE — ED Notes (Signed)
Called x1, no answer

## 2017-03-29 NOTE — ED Notes (Signed)
Pt called to triage room x1.  No answer.

## 2017-03-29 NOTE — Discharge Instructions (Signed)
We are running tests to determine the exact cause of the irritation and the results should be back on Monday.  I am referring you to an excellent gynecologist to see if vaginal hormonal cream will stop the recurrent problem from happening anymore.  I am prescribing a yeast medication that should help based on your history and urine findings today.

## 2017-03-29 NOTE — ED Notes (Signed)
Pt called to triage x3.  No answer.  Pt will be discharged from our system.

## 2017-03-29 NOTE — ED Provider Notes (Addendum)
  [place]   [id number] 03/29/17 Arrival Time: 1529   SUBJECTIVE:  Jacqueline Richards is a 46 y.o. female who presents to the urgent care with complaint of vaginal irritation for 1 week.  This seems to recur every month after her periods for the last year.  Discharge is yellow and green with bad odor     History reviewed. No pertinent past medical history. Family History  Problem Relation Age of Onset  . Hypertension Mother    Social History   Social History  . Marital status: Single    Spouse name: N/A  . Number of children: N/A  . Years of education: N/A   Occupational History  . Not on file.   Social History Main Topics  . Smoking status: Never Smoker  . Smokeless tobacco: Never Used  . Alcohol use Yes  . Drug use: No  . Sexual activity: Yes   Other Topics Concern  . Not on file   Social History Narrative  . No narrative on file   No outpatient prescriptions have been marked as taking for the 03/29/17 encounter [facility] Encounter).   No Known Allergies    ROS: As per HPI, remainder of ROS negative.   OBJECTIVE:   Vitals:   03/29/17 1543  BP: 131/86  Pulse: (!) 101  Temp: 97.9 F (36.6 C)  TempSrc: Oral  SpO2: 99%     General appearance: alert; no distress Eyes: PERRL; EOMI; conjunctiva normal HENT: normocephalic; atraumatic; TMs normal, canal normal, external ears normal without trauma; nasal mucosa normal; oral mucosa normal Neck: supple Back: no CVA tenderness Extremities: no cyanosis or edema; symmetrical with no gross deformities Skin: warm and dry Neurologic: normal gait; grossly normal Psychological: alert and cooperative; normal mood and affect      Labs:  Results for orders placed or performed during the hospital encounter of 03/29/17  POCT urinalysis dip (device)  Result Value Ref Range   Glucose, UA NEGATIVE NEGATIVE mg/dL   Bilirubin Urine SMALL (A) NEGATIVE   Ketones, ur NEGATIVE NEGATIVE mg/dL   Specific Gravity, Urine >=1.030 1.005 - 1.030   Hgb urine dipstick NEGATIVE NEGATIVE   pH 6.0 5.0 - 8.0   Protein, ur 30 (A) NEGATIVE mg/dL   Urobilinogen, UA 0.2 0.0 - 1.0 mg/dL   Nitrite NEGATIVE NEGATIVE   Leukocytes, UA NEGATIVE NEGATIVE  Pregnancy, urine POC  Result Value Ref Range   Preg Test, Ur NEGATIVE NEGATIVE    Labs Reviewed  POCT URINALYSIS DIP (DEVICE) - Abnormal; Notable for the following:       Result Value   Bilirubin Urine SMALL (*)    Protein, ur 30 (*)    All other components within normal limits  POCT PREGNANCY, URINE  URINE CYTOLOGY ANCILLARY ONLY    No results found.     ASSESSMENT & PLAN:  1. Vaginitis and vulvovaginitis     Meds ordered this encounter  Medications  . fluconazole (DIFLUCAN) 150 MG tablet    Sig: Take 1 tablet (150 mg total) by mouth once. Repeat if needed    Dispense:  2 tablet    Refill:  5    Reviewed expectations re: course of current medical issues. Questions answered. Outlined signs and symptoms indicating need for more acute intervention. Patient verbalized understanding. After Visit Summary given.    Procedures:      Robyn Haber, MD 03/29/17 1559    Robyn Haber, MD 03/29/17 1610

## 2017-03-29 NOTE — ED Notes (Signed)
Pt called to Room 3 for triage.  No answer x1.

## 2017-03-29 NOTE — ED Triage Notes (Signed)
Pt reports getting BV every other month after her period.  She reports urinary leaking, but no discharge.  She is a poor historian.  She states she is given something to insert in vagina and diflucan.

## 2017-03-31 LAB — URINE CYTOLOGY ANCILLARY ONLY
Chlamydia: NEGATIVE
Neisseria Gonorrhea: NEGATIVE
Trichomonas: NEGATIVE

## 2017-04-02 LAB — URINE CYTOLOGY ANCILLARY ONLY
Bacterial vaginitis: POSITIVE — AB
Candida vaginitis: NEGATIVE

## 2017-04-08 ENCOUNTER — Ambulatory Visit (HOSPITAL_COMMUNITY)
Admission: EM | Admit: 2017-04-08 | Discharge: 2017-04-08 | Disposition: A | Discharge status: Home / Self Care | Payer: Medicaid Other | Attending: Family Medicine | Admitting: Family Medicine

## 2017-04-08 ENCOUNTER — Encounter (HOSPITAL_COMMUNITY): Payer: Self-pay | Admitting: Emergency Medicine

## 2017-04-08 DIAGNOSIS — M25512 Pain in left shoulder: Secondary | ICD-10-CM | POA: Diagnosis not present

## 2017-04-08 MED ORDER — PERMETHRIN 5 % EX CREA: TOPICAL_CREAM | CUTANEOUS | 0 refills | Status: DC

## 2017-04-08 MED ORDER — NAPROXEN 500 MG PO TABS: 500.0000 mg | ORAL_TABLET | Freq: Two times a day (BID) | ORAL | 0 refills | Status: AC

## 2017-04-08 NOTE — Discharge Instructions (Signed)
Start naproxen as directed. Ice compress. This can take up to 3-4 weeks to completely resolve, but you should be feeling better each week. Follow up here or with PCP if symptoms worsen, changes for reevaluation.   Use permethrin as directed for scabies. Follow directions on attachment for cleaning of the house.

## 2017-04-08 NOTE — ED Provider Notes (Signed)
[name]    CSN: [id number] Arrival date & time: 04/08/17  1942     History   Chief Complaint Chief Complaint  Patient presents with  . Shoulder Pain    HPI Jacqueline Richards is a 46 y.o. female.   46 year old female comes in for 2 day history of left shoulder pain. 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 She has been using Tylenol with good relief. She states she now has better range of motion than when she first started but continues to have some pain with movement. Denies numbness, tingling, swelling of the joint.  Patient states also exposed to scabies at the nursing home, is starting to have itching around her fingers, denies any rashes or burrowing marks. Denies fever, chills, night sweats. Denies spreading erythema, increased warmth, increased pain.      History reviewed. No pertinent past medical history.  There are no active problems to display for this patient.   Past Surgical History:  Procedure Laterality Date  . CESAREAN SECTION      OB History    No data available       Home Medications    Prior to Admission medications   Medication Sig Start Date End Date Taking? Authorizing Provider  Fluocinolone Acetonide Body (DERMA-SMOOTHE/FS BODY) 0.01 % OIL Use once daily as directed 08/28/16   Janne Napoleon, NP  ipratropium (ATROVENT) 0.06 % nasal spray Place 2 sprays into both nostrils 4 (four) times daily. 09/13/16   Lysbeth Penner, FNP  metroNIDAZOLE (METROGEL VAGINAL) 0.75 % vaginal gel Place 1 Applicatorful vaginally 2 (two) times daily. 03/29/17   Robyn Haber, MD  naproxen (NAPROSYN) 500 MG tablet Take 1 tablet (500 mg total) by mouth 2 (two) times daily. 04/08/17 04/18/17  Tasia Catchings, Falicia Lizotte V, PA-C  pantoprazole (PROTONIX) 20 MG tablet Take 1 tablet (20 mg total) by mouth daily. 11/13/16   Shary Decamp, PA-C  permethrin (ELIMITE) 5 % cream Apply to affected area once. 04/08/17   Ok Edwards, PA-C     Family History Family History  Problem Relation Age of Onset  . Hypertension Mother     Social History Social History  Substance Use Topics  . Smoking status: Never Smoker  . Smokeless tobacco: Never Used  . Alcohol use Yes     Allergies   Patient has no known allergies.   Review of Systems Review of Systems  Reason unable to perform ROS: See HPI as above.     Physical Exam Triage Vital Signs ED Triage Vitals  Enc Vitals Group     BP 04/08/17 1959 107/69     Pulse Rate 04/08/17 1957 85     Resp 04/08/17 1957 17     Temp 04/08/17 1957 97.8 F (36.6 C)     Temp Source 04/08/17 1957 Oral     SpO2 04/08/17 1957 100 %     Weight 04/08/17 2001 139 lb (63 kg)     Height 04/08/17 1958 5\' 2"  (1.575 m)     Head Circumference --      Peak Flow --      Pain Score 04/08/17 1958 8     Pain Loc --      Pain Edu? --      Excl. in [place]? --    No data found.   Updated Vital Signs BP 107/69 (BP Location: Right Arm)   Pulse 84   Temp 97.8 F (  36.6 C) (Oral)   Resp 16   Ht 5\' 2"  (1.575 m)   Wt 139 lb (63 kg)   LMP 03/23/2017 (Exact Date)   SpO2 100%   BMI 25.42 kg/m   Physical Exam  Constitutional: She is oriented to person, place, and time. She appears well-developed and well-nourished. No distress.  HENT:  Head: Normocephalic and atraumatic.  Eyes: Pupils are equal, round, and reactive to light. Conjunctivae are normal.  Neck: Normal range of motion. Neck supple. No spinous process tenderness and no muscular tenderness present. Normal range of motion present.  Cardiovascular: Normal rate, regular rhythm and normal heart sounds.  Exam reveals no gallop and no friction rub.   No murmur heard. Pulmonary/Chest: Effort normal and breath sounds normal. She has no wheezes. She has no rales.  Musculoskeletal:  No tenderness on palpation of the spinous processes. Mild tenderness to palpation of the left shoulder/trapezius muscle. Full range of motion. Strength normal  and equal bilaterally. Sensation intact and equal bilaterally.  Radial pulses 2+ and equal bilaterally. Capillary refill unable to assess due to nail polish.  Neurological: She is alert and oriented to person, place, and time.  Skin: Skin is warm and dry.  No obvious rashes seen     UC Treatments / Results  Labs (all labs ordered are listed, but only abnormal results are displayed) Labs Reviewed - No data to display  EKG  EKG Interpretation None       Radiology No results found.  Procedures Procedures (including critical care time)  Medications Ordered in UC Medications - No data to display   Initial Impression / Assessment and Plan / UC Course  I have reviewed the triage vital signs and the nursing notes.  Pertinent labs & imaging results that were available during my care of the patient were reviewed by me and considered in my medical decision making (see chart for details).     Start NSAID as directed for pain and inflammation. Ice/heat compresses. Discussed with patient strain can take up to 3-4 weeks to resolve, but should be getting better each week. Permethrin cream for scabies exposure. Information of house cleaning provided for patient. Return precautions given.    Final Clinical Impressions(s) / UC Diagnoses   Final diagnoses:  Acute pain of left shoulder    New Prescriptions Discharge Medication List as of 04/08/2017  8:43 PM    START taking these medications   Details  naproxen (NAPROSYN) 500 MG tablet Take 1 tablet (500 mg total) by mouth 2 (two) times daily., Starting Tue 04/08/2017, Until Fri 04/18/2017, Normal    permethrin (ELIMITE) 5 % cream Apply to affected area once., Normal          Ok Edwards, PA-C 04/08/17 2049

## 2017-04-08 NOTE — ED Triage Notes (Signed)
Pt. 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 It hurts when I move it. 2 days ago.

## 2017-04-15 ENCOUNTER — Ambulatory Visit (HOSPITAL_COMMUNITY)
Admission: EM | Admit: 2017-04-15 | Discharge: 2017-04-15 | Disposition: A | Discharge status: Home / Self Care | Payer: Medicaid Other | Attending: Family Medicine | Admitting: Family Medicine

## 2017-04-15 ENCOUNTER — Encounter (HOSPITAL_COMMUNITY): Payer: Self-pay | Admitting: Emergency Medicine

## 2017-04-15 DIAGNOSIS — M25512 Pain in left shoulder: Secondary | ICD-10-CM

## 2017-04-15 DIAGNOSIS — G8911 Acute pain due to trauma: Secondary | ICD-10-CM | POA: Diagnosis not present

## 2017-04-15 NOTE — ED Triage Notes (Signed)
Pt here for a f/u and to get medical clearance to return to work  Just needs letter stating she does not have scabies  Seen on 9/25   A&O x4... NAD... Ambulatory

## 2017-04-15 NOTE — Discharge Instructions (Signed)
You have no evidence of any communicable skin disease such as scabies or lice.

## 2017-04-15 NOTE — ED Provider Notes (Signed)
[place]   [id number] 04/15/17 Arrival Time: 1962   SUBJECTIVE:  Jacqueline Richards is a 46 y.o. female who presents to the urgent care with complaint of left shoulder pain.  She works as a [employer]ar[employer] manager and was evaluated here several days ago and started on Naprosyn.  She is still having pain in the left shoulder and is going to see workman's comp doctor today  She also needs to have a noticing that she has no sign of scabiesor lice since there is been an outbreak at her facility recently.   History reviewed. No pertinent past medical history. Family History  Problem Relation Age of Onset  . Hypertension Mother    Social History   Social History  . Marital status: Single    Spouse name: N/A  . Number of children: N/A  . Years of education: N/A   Occupational History  . Not on file.   Social History Main Topics  . Smoking status: Never Smoker  . Smokeless tobacco: Never Used  . Alcohol use Yes  . Drug use: No  . Sexual activity: Yes   Other Topics Concern  . Not on file   Social History Narrative  . No narrative on file   Current Meds  Medication Sig  . [DISCONTINUED] permethrin (ELIMITE) 5 % cream Apply to affected area once.   No Known Allergies    ROS: As per HPI, remainder of ROS negative.   OBJECTIVE:   Vitals:   04/15/17 1517  BP: 117/77  Pulse: 78  Resp: 16  Temp: 98.2 F (36.8 C)  TempSrc: Oral  SpO2: 98%     General appearance: alert; no distress Eyes: PERRL; EOMI; conjunctiva normal HENT: normocephalic; atraumatic; TMs normal, canal normal, external ears normal without trauma; nasal mucosa normal; oral mucosa normal Neck: supple Back: no CVA tenderness Extremities: no cyanosis or edema; symmetrical with no gross deformities;left anterior shoulder joint line is tender to palpation. Skin: warm and dry Neurologic: normal gait; grossly normal Psychological: alert and cooperative; normal mood and affect      Labs:  Results  for orders placed or performed during the hospital encounter of 03/29/17  POCT urinalysis dip (device)  Result Value Ref Range   Glucose, UA NEGATIVE NEGATIVE mg/dL   Bilirubin Urine SMALL (A) NEGATIVE   Ketones, ur NEGATIVE NEGATIVE mg/dL   Specific Gravity, Urine >=1.030 1.005 - 1.030   Hgb urine dipstick NEGATIVE NEGATIVE   pH 6.0 5.0 - 8.0   Protein, ur 30 (A) NEGATIVE mg/dL   Urobilinogen, UA 0.2 0.0 - 1.0 mg/dL   Nitrite NEGATIVE NEGATIVE   Leukocytes, UA NEGATIVE NEGATIVE  Pregnancy, urine POC  Result Value Ref Range   Preg Test, Ur NEGATIVE NEGATIVE  Urine cytology ancillary only  Result Value Ref Range   Chlamydia Negative    Neisseria gonorrhea Negative    Trichomonas Negative   Urine cytology ancillary only  Result Value Ref Range   Bacterial vaginitis **POSITIVE for Gardnerella vaginalis** (A)    Candida vaginitis Negative for Candida Vaginitis Microorganisms     Labs Reviewed - No data to display  No results found.     ASSESSMENT & PLAN:  1. Acute shoulder pain due to trauma, left     No orders of the defined types were placed in this encounter.  Follow up with workers comp doctor Reviewed expectations re: course of current medical issues. Questions answered. Outlined signs and symptoms indicating need for more acute intervention. Patient verbalized understanding.  After Visit Summary given.    Procedures:      Robyn Haber, MD 04/15/17 1527

## 2017-05-14 ENCOUNTER — Inpatient Hospital Stay (HOSPITAL_COMMUNITY)
Admission: AD | Admit: 2017-05-14 | Discharge: 2017-05-14 | Disposition: A | Discharge status: Home / Self Care | Payer: Medicaid Other | Source: Ambulatory Visit | Attending: Obstetrics & Gynecology | Admitting: Obstetrics & Gynecology

## 2017-05-14 ENCOUNTER — Encounter (HOSPITAL_COMMUNITY): Payer: Self-pay | Admitting: *Deleted

## 2017-05-14 DIAGNOSIS — D649 Anemia, unspecified: Secondary | ICD-10-CM | POA: Insufficient documentation

## 2017-05-14 DIAGNOSIS — N939 Abnormal uterine and vaginal bleeding, unspecified: Secondary | ICD-10-CM

## 2017-05-14 DIAGNOSIS — N898 Other specified noninflammatory disorders of vagina: Secondary | ICD-10-CM | POA: Insufficient documentation

## 2017-05-14 DIAGNOSIS — D500 Iron deficiency anemia secondary to blood loss (chronic): Secondary | ICD-10-CM

## 2017-05-14 DIAGNOSIS — D259 Leiomyoma of uterus, unspecified: Secondary | ICD-10-CM | POA: Diagnosis not present

## 2017-05-14 DIAGNOSIS — Z79899 Other long term (current) drug therapy: Secondary | ICD-10-CM | POA: Diagnosis not present

## 2017-05-14 HISTORY — DX: Benign neoplasm of connective and other soft tissue, unspecified: D21.9

## 2017-05-14 LAB — CBC
HCT: 28.4 % — ABNORMAL LOW (ref 36.0–46.0)
HEMOGLOBIN: 9.3 g/dL — AB (ref 12.0–15.0)
MCH: 26.8 pg (ref 26.0–34.0)
MCHC: 32.7 g/dL (ref 30.0–36.0)
MCV: 81.8 fL (ref 78.0–100.0)
Platelets: 345 10*3/uL (ref 150–400)
RBC: 3.47 MIL/uL — AB (ref 3.87–5.11)
RDW: 17.1 % — ABNORMAL HIGH (ref 11.5–15.5)
WBC: 6.1 10*3/uL (ref 4.0–10.5)

## 2017-05-14 LAB — POCT PREGNANCY, URINE: Preg Test, Ur: NEGATIVE

## 2017-05-14 MED ORDER — MEGESTROL ACETATE 40 MG PO TABS: ORAL_TABLET | ORAL | 3 refills | Status: AC

## 2017-05-14 NOTE — MAU Note (Signed)
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 Told she needed a hysterectomy"right a way.' C/O heavy bleeding with "chunks " coming out> Having pain and cramping. (none today).

## 2017-05-14 NOTE — Discharge Instructions (Signed)
Abnormal Uterine Bleeding °Abnormal uterine bleeding means bleeding more than usual from your uterus. It can include: °· Bleeding between periods. °· Bleeding after sex. °· Bleeding that is heavier than normal. °· Periods that last longer than usual. °· Bleeding after you have stopped having your period (menopause). ° °There are many problems that may cause this. You should see a doctor for any kind of bleeding that is not normal. Treatment depends on the cause of the bleeding. °Follow these instructions at home: °· Watch your condition for any changes. °· Do not use tampons, douche, or have sex, if your doctor tells you not to. °· Change your pads often. °· Get regular well-woman exams. Make sure they include a pelvic exam and cervical cancer screening. °· Keep all follow-up visits as told by your doctor. This is important. °Contact a doctor if: °· The bleeding lasts more than one week. °· You feel dizzy at times. °· You feel like you are going to throw up (nauseous). °· You throw up. °Get help right away if: °· You pass out. °· You have to change pads every hour. °· You have belly (abdominal) pain. °· You have a fever. °· You get sweaty. °· You get weak. °· You passing large blood clots from your vagina. °Summary °· Abnormal uterine bleeding means bleeding more than usual from your uterus. °· There are many problems that may cause this. You should see a doctor for any kind of bleeding that is not normal. °· Treatment depends on the cause of the bleeding. °This information is not intended to replace advice given to you by your health care provider. Make sure you discuss any questions you have with your health care provider. °Document Released: 04/28/2009 Document Revised: 06/25/2016 Document Reviewed: 06/25/2016 °Elsevier Interactive Patient Education © 2017 [employer] ° ° °Uterine Fibroids °Uterine fibroids are tissue masses (tumors) that can develop in the womb (uterus). They are also called leiomyomas. This  type of tumor is not cancerous (benign) and does not spread to other parts of the body outside of the pelvic area, which is between the hip bones. Occasionally, fibroids may develop in the fallopian tubes, in the cervix, or on the support structures (ligaments) that surround the uterus. °You can have one or many fibroids. Fibroids can vary in size, weight, and where they grow in the uterus. Some can become quite large. Most fibroids do not require medical treatment. °What are the causes? °A fibroid can develop when a single uterine cell keeps growing (replicating). Most cells in the human body have a control mechanism that keeps them from replicating without control. °What are the signs or symptoms? °Symptoms may include: °· Heavy bleeding during your period. °· Bleeding or spotting between periods. °· Pelvic pain and pressure. °· Bladder problems, such as needing to urinate more often (urinary frequency) or urgently. °· Inability to reproduce offspring (infertility). °· Miscarriages. ° °How is this diagnosed? °Uterine fibroids are diagnosed through a physical exam. Your health care provider may feel the lumpy tumors during a pelvic exam. Ultrasonography and an MRI may be done to determine the size, location, and number of fibroids. °How is this treated? °Treatment may include: °· Watchful waiting. This involves getting the fibroid checked by your health care provider to see if it grows or shrinks. Follow your health care provider's recommendations for how often to have this checked. °· Hormone medicines. These can be taken by mouth or given through an intrauterine device (IUD). °· Surgery. °? Removing   the fibroids (myomectomy) or the uterus (hysterectomy). °? Removing blood supply to the fibroids (uterine artery embolization). ° °If fibroids interfere with your fertility and you want to become pregnant, your health care provider may recommend having the fibroids removed. °Follow these instructions at home: °· Keep  all follow-up visits as directed by your health care provider. This is important. °· Take over-the-counter and prescription medicines only as told by your health care provider. °? If you were prescribed a hormone treatment, take the hormone medicines exactly as directed. °· Ask your health care provider about taking iron pills and increasing the amount of dark green, leafy vegetables in your diet. These actions can help to boost your blood iron levels, which may be affected by heavy menstrual bleeding. °· Pay close attention to your period and tell your health care provider about any changes, such as: °? Increased blood flow that requires you to use more pads or tampons than usual per month. °? A change in the number of days that your period lasts per month. °? A change in symptoms that are associated with your period, such as abdominal cramping or back pain. °Contact a health care provider if: °· You have pelvic pain, back pain, or abdominal cramps that cannot be controlled with medicines. °· You have an increase in bleeding between and during periods. °· You soak tampons or pads in a half hour or less. °· You feel lightheaded, extra tired, or weak. °Get help right away if: °· You faint. °· You have a sudden increase in pelvic pain. °This information is not intended to replace advice given to you by your health care provider. Make sure you discuss any questions you have with your health care provider. °Document Released: 06/28/2000 Document Revised: 02/29/2016 Document Reviewed: 12/28/2013 °Elsevier Interactive Patient Education © 2018 Elsevier Inc[employer] ° ° °

## 2017-05-14 NOTE — MAU Provider Note (Signed)
Chief Complaint:  Abdominal Pain and Vaginal Bleeding   First Provider Initiated Contact with Patient 05/14/17 1645       HPI: Jacqueline Richards is a 46 y.o. G6P6 who presents to maternity admissions reporting heavy bleeding with periods, vaginal discharge with odor (that was treated last visit), and uterine fibroids.  Is worried she has cancer. STates had two relatives who were told they had fibroids but it was cancer and they died.  Wants a hysterectomy right now. . She reports vaginal bleeding, no vaginal itching/burning, urinary symptoms, h/a, dizziness, n/v, or fever/chills.    Abdominal Pain  This is a recurrent problem. The current episode started in the past 7 days. The onset quality is gradual. The problem occurs intermittently. The problem has been unchanged. The pain is located in the suprapubic region, LLQ and RLQ. The pain is moderate. The quality of the pain is aching and cramping. The abdominal pain does not radiate. Pertinent negatives include no constipation, diarrhea, dysuria, myalgias, nausea or vomiting. Nothing aggravates the pain. The pain is relieved by nothing. Treatments tried: ibuprofen. The treatment provided mild relief.  Vaginal Bleeding  The patient's primary symptoms include a genital odor, vaginal bleeding and vaginal discharge. The patient's pertinent negatives include no genital itching or genital lesions. This is a recurrent problem. The current episode started in the past 7 days. The problem occurs constantly. The problem has been unchanged. Associated symptoms include abdominal pain. Pertinent negatives include no constipation, diarrhea, dysuria, nausea or vomiting. The vaginal discharge was bloody. The vaginal bleeding is typical of menses. She has been passing clots. She has not been passing tissue. Nothing aggravates the symptoms. She has tried nothing for the symptoms.    RN Note: Pt reports she has been dx with fibriods as big as a 77 month old fetus. Told she  needed a hysterectomy"right a way.' C/O heavy bleeding with "chunks " coming out> Having pain and cramping. (none today).  Past Medical History: No past medical history on file.  Past obstetric history: OB History  Gravida Para Term Preterm AB Living  6 6          SAB TAB Ectopic Multiple Live Births               # Outcome Date GA Lbr Len/2nd Weight Sex Delivery Anes PTL Lv  6 Para           5 Para           4 Para           3 Para           2 Para           1 Para               Past Surgical History: Past Surgical History:  Procedure Laterality Date  . CESAREAN SECTION      Family History: Family History  Problem Relation Age of Onset  . Hypertension Mother     Social History: Social History  Substance Use Topics  . Smoking status: Never Smoker  . Smokeless tobacco: Never Used  . Alcohol use Yes    Allergies: No Known Allergies  Meds:  Prescriptions Prior to Admission  Medication Sig Dispense Refill Last Dose  . Fluocinolone Acetonide Body (DERMA-SMOOTHE/FS BODY) 0.01 % OIL Use once daily as directed 120 mL 0 Unknown at Unknown time  . ipratropium (ATROVENT) 0.06 % nasal spray Place 2 sprays into both nostrils 4 (four) times  daily. 15 mL 0 Unknown at Unknown time  . metroNIDAZOLE (METROGEL VAGINAL) 0.75 % vaginal gel Place 1 Applicatorful vaginally 2 (two) times daily. 70 g 5 Unknown at Unknown time  . pantoprazole (PROTONIX) 20 MG tablet Take 1 tablet (20 mg total) by mouth daily. 30 tablet 0 Unknown at Unknown time    I have reviewed patient's Past Medical Hx, Surgical Hx, Family Hx, Social Hx, medications and allergies.  ROS:  Review of Systems  Gastrointestinal: Positive for abdominal pain. Negative for constipation, diarrhea, nausea and vomiting.  Genitourinary: Positive for vaginal bleeding and vaginal discharge. Negative for dysuria.  Musculoskeletal: Negative for myalgias.   Other systems negative     Physical Exam  Patient Vitals for the  past 24 hrs:  BP Temp Temp src Pulse Resp Height Weight  05/14/17 1528 125/61 98.4 F (36.9 C) Oral 89 18 5\' 2"  (1.575 m) 144 lb (65.3 kg)   Constitutional: Well-developed, well-nourished female in no acute distress.  Cardiovascular: normal rate and rhythm, no ectopy audible, S1 & S2 heard, no murmur Respiratory: normal effort, no distress. Lungs CTAB with no wheezes or crackles GI: Abd soft, non-tender.  Nondistended.  No rebound, No guarding.  Bowel Sounds audible  MS: Extremities nontender, no edema, normal ROM Neurologic: Alert and oriented x 4.   Grossly nonfocal. GU: Neg CVAT. Skin:  Warm and Dry Psych:  Affect appropriate.  PELVIC EXAM: Cervix pink, visually closed, without lesion, scant red discharge, vaginal walls and external genitalia normal Bimanual exam: Cervix firm, anterior, neg CMT, uterus nontender, slightly enlarged, perhaps to 8 - 10 wk size, adnexa without tenderness, enlargement, or mass    Labs:    Results for orders placed or performed during the hospital encounter of 05/14/17 (from the past 24 hour(s))  Pregnancy, urine POC     Status: None   Collection Time: 05/14/17  3:45 PM  Result Value Ref Range   Preg Test, Ur NEGATIVE NEGATIVE  CBC     Status: Abnormal   Collection Time: 05/14/17  4:07 PM  Result Value Ref Range   WBC 6.1 4.0 - 10.5 K/uL   RBC 3.47 (L) 3.87 - 5.11 MIL/uL   Hemoglobin 9.3 (L) 12.0 - 15.0 g/dL   HCT 28.4 (L) 36.0 - 46.0 %   MCV 81.8 78.0 - 100.0 fL   MCH 26.8 26.0 - 34.0 pg   MCHC 32.7 30.0 - 36.0 g/dL   RDW 17.1 (H) 11.5 - 15.5 %   Platelets 345 150 - 400 K/uL     Imaging:  No results found.  MAU Course/MDM: I have ordered labs as follows: CBC which showed stable hemoglobin, slightly lower Imaging ordered: outpatient [place] Results reviewed.   .   Pt stable at time of discharge.  Assessment: Abnormal uterine bleeding Fibroids Mild anemia  Plan: Discharge home Recommend Followup in clinic Rx sent for Megace for  AUB Message sent to clinic for f/u with MD in [place] office [place] ordered  Encouraged to return here or to other Urgent Care/ED if she develops worsening of symptoms, increase in pain, fever, or other concerning symptoms.   Hansel Feinstein CNM, MSN Certified Nurse-Midwife 05/14/2017 4:46 PM

## 2017-05-17 LAB — CULTURE, GROUP A STREP (THRC)

## 2017-05-21 ENCOUNTER — Ambulatory Visit (HOSPITAL_COMMUNITY)
Admission: EM | Admit: 2017-05-21 | Discharge: 2017-05-21 | Disposition: A | Discharge status: Home / Self Care | Payer: Medicaid Other | Attending: Family Medicine | Admitting: Family Medicine

## 2017-05-21 ENCOUNTER — Encounter (HOSPITAL_COMMUNITY): Payer: Self-pay | Admitting: Emergency Medicine

## 2017-05-21 DIAGNOSIS — R0982 Postnasal drip: Secondary | ICD-10-CM | POA: Diagnosis not present

## 2017-05-21 DIAGNOSIS — R05 Cough: Secondary | ICD-10-CM

## 2017-05-21 DIAGNOSIS — R059 Cough, unspecified: Secondary | ICD-10-CM

## 2017-05-21 MED ORDER — GUAIFENESIN-CODEINE 100-10 MG/5ML PO SYRP: 5.0000 mL | ORAL_SOLUTION | ORAL | 0 refills | Status: AC | PRN

## 2017-05-21 NOTE — Discharge Instructions (Signed)
And cough is due primarily to drainage in the back of the throat. The cough medicine that has been prescribed can help with the cough however the medication to help with the drainage does not come and prescription. He may pickup Chlor-Trimeton or chlorpheniramine 4 mg tablets and take 1/2-1 tablet every 4 hours for drainage. This may cause some drowsiness. He also may take Claritin 1 a day to help with drainage.

## 2017-05-21 NOTE — ED Provider Notes (Signed)
[facility]    CSN: [id number] Arrival date & time: 05/21/17  1002     History   Chief Complaint No chief complaint on file.   HPI Karter Dysert is a 46 y.o. female.   46 year old female complaining of cough for one week. She has taken and OTC medication which she states was a natural remedy tight. Has not helped. Denies fever or chills. Denies shortness of breath. No history of smoking or asthma. She is requesting a stronger cough medicine.      Past Medical History:  Diagnosis Date  . Fibroid     There are no active problems to display for this patient.   Past Surgical History:  Procedure Laterality Date  . CESAREAN SECTION    . TUBAL LIGATION      OB History    Gravida Para Term Preterm AB Living   7 6     1 6    SAB TAB Ectopic Multiple Live Births   1               Home Medications    Prior to Admission medications   Medication Sig Start Date End Date Taking? Authorizing Provider  Fluocinolone Acetonide Body (DERMA-SMOOTHE/FS BODY) 0.01 % OIL Use once daily as directed 08/28/16   Janne Napoleon, NP  guaiFENesin-codeine (CHERATUSSIN AC) 100-10 MG/5ML syrup Take 5 mLs every 4 (four) hours as needed by mouth for cough or congestion. 05/21/17   Janne Napoleon, NP  ipratropium (ATROVENT) 0.06 % nasal spray Place 2 sprays into both nostrils 4 (four) times daily. 09/13/16   Lysbeth Penner, FNP  megestrol (MEGACE) 40 MG tablet Take two tablets three times daily for 3 days, then two tablets twice daily for 3 days, then one tablet twice daily 05/14/17   Seabron Spates, CNM  metroNIDAZOLE (METROGEL VAGINAL) 0.75 % vaginal gel Place 1 Applicatorful vaginally 2 (two) times daily. 03/29/17   Robyn Haber, MD  pantoprazole (PROTONIX) 20 MG tablet Take 1 tablet (20 mg total) by mouth daily. 11/13/16   Shary Decamp, PA-C    Family History Family History  Problem Relation Age of Onset  . Hypertension Mother     Social History Social History   Tobacco  Use  . Smoking status: Never Smoker  . Smokeless tobacco: Never Used  Substance Use Topics  . Alcohol use: Yes  . Drug use: No     Allergies   Patient has no known allergies.   Review of Systems Review of Systems  Constitutional: Negative.   HENT: Positive for postnasal drip.   Eyes: Negative.   Respiratory: Positive for cough. Negative for shortness of breath.   Gastrointestinal: Negative.   Neurological: Negative.   All other systems reviewed and are negative.    Physical Exam Triage Vital Signs ED Triage Vitals [05/21/17 1014]  Enc Vitals Group     BP 113/62     Pulse Rate 85     Resp 16     Temp 98.5 F (36.9 C)     Temp Source Oral     SpO2 100 %     Weight      Height      Head Circumference      Peak Flow      Pain Score      Pain Loc      Pain Edu?      Excl. in [place]?    No data found.  Updated Vital Signs BP 113/62  Pulse 85   Temp 98.5 F (36.9 C) (Oral)   Resp 16   LMP 05/03/2017   SpO2 100%   Visual Acuity Right Eye Distance:   Left Eye Distance:   Bilateral Distance:    Right Eye Near:   Left Eye Near:    Bilateral Near:     Physical Exam  Constitutional: She is oriented to person, place, and time. She appears well-developed and well-nourished.  HENT:  Head: Normocephalic and atraumatic.  Oropharynx with minimal erythema, mild amount of clear PND. No swelling or exudate.  Eyes: EOM are normal.  Neck: Normal range of motion. Neck supple.  Cardiovascular: Normal rate, regular rhythm, normal heart sounds and intact distal pulses.  Pulmonary/Chest: Effort normal and breath sounds normal. No stridor. No respiratory distress. She has no wheezes. She has no rales.  Lungs perfectly clear. He will see.  Lymphadenopathy:    She has no cervical adenopathy.  Neurological: She is alert and oriented to person, place, and time.  Nursing note and vitals reviewed.    UC Treatments / Results  Labs (all labs ordered are listed, but only  abnormal results are displayed) Labs Reviewed - No data to display  EKG  EKG Interpretation None       Radiology No results found.  Procedures Procedures (including critical care time)  Medications Ordered in UC Medications - No data to display   Initial Impression / Assessment and Plan / UC Course  I have reviewed the triage vital signs and the nursing notes.  Pertinent labs & imaging results that were available during my care of the patient were reviewed by me and considered in my medical decision making (see chart for details).    And cough is due primarily to drainage in the back of the throat. The cough medicine that has been prescribed can help with the cough however the medication to help with the drainage does not come and prescription. He may pickup Chlor-Trimeton or chlorpheniramine 4 mg tablets and take 1/2-1 tablet every 4 hours for drainage. This may cause some drowsiness. He also may take Claritin 1 a day to help with drainage.    Final Clinical Impressions(s) / UC Diagnoses   Final diagnoses:  Cough  PND (post-nasal drip)    ED Discharge Orders        Ordered    guaiFENesin-codeine (CHERATUSSIN AC) 100-10 MG/5ML syrup  Every 4 hours PRN     05/21/17 1031       Controlled Substance Prescriptions [place] Controlled Substance Registry consulted? Not Applicable   Janne Napoleon, NP 05/21/17 [phone]

## 2017-05-21 NOTE — ED Triage Notes (Signed)
PT on phone during triage. No allergies to meds

## 2017-05-23 ENCOUNTER — Ambulatory Visit (HOSPITAL_COMMUNITY): Payer: Medicaid Other

## 2017-05-27 ENCOUNTER — Ambulatory Visit: Payer: Medicaid Other | Admitting: Obstetrics and Gynecology

## 2017-05-30 ENCOUNTER — Ambulatory Visit (HOSPITAL_COMMUNITY)
Admission: RE | Admit: 2017-05-30 | Discharge: 2017-05-30 | Disposition: A | Discharge status: Home / Self Care | Payer: Medicaid Other | Source: Ambulatory Visit | Attending: Advanced Practice Midwife | Admitting: Advanced Practice Midwife

## 2017-05-30 DIAGNOSIS — N939 Abnormal uterine and vaginal bleeding, unspecified: Secondary | ICD-10-CM

## 2017-05-30 DIAGNOSIS — D259 Leiomyoma of uterus, unspecified: Secondary | ICD-10-CM

## 2017-05-30 DIAGNOSIS — D500 Iron deficiency anemia secondary to blood loss (chronic): Secondary | ICD-10-CM | POA: Insufficient documentation

## 2017-06-03 ENCOUNTER — Telehealth: Payer: Self-pay | Admitting: General Practice

## 2017-06-03 NOTE — Telephone Encounter (Signed)
Patient called and left message requesting ultrasound results. Called patient and reviewed ultrasound results with her. Patient verbalized understanding & states okay what now. Told patient she had an appt scheduled with our [place] office on 11/13 which she missed. Patient states she wasn't aware of appt and would like rescheduled. Told patient I can provide phone number to office and she can call to reschedule. Patient requests information be left on voicemail. Patient states "so they didn't see cancer right." Reviewed ultrasound report again with patient. Patient verbalized understanding & had no other questions

## 2017-06-19 ENCOUNTER — Ambulatory Visit (HOSPITAL_COMMUNITY)
Admission: EM | Admit: 2017-06-19 | Discharge: 2017-06-19 | Disposition: A | Discharge status: Home / Self Care | Payer: Medicaid Other | Attending: Family Medicine | Admitting: Family Medicine

## 2017-06-19 ENCOUNTER — Encounter (HOSPITAL_COMMUNITY): Payer: Self-pay | Admitting: Emergency Medicine

## 2017-06-19 DIAGNOSIS — L309 Dermatitis, unspecified: Secondary | ICD-10-CM

## 2017-06-19 MED ORDER — PERMETHRIN 5 % EX CREA: TOPICAL_CREAM | CUTANEOUS | 1 refills | Status: DC

## 2017-06-19 MED ORDER — PREDNISONE 10 MG (21) PO TBPK: ORAL_TABLET | ORAL | 0 refills | Status: AC

## 2017-06-19 NOTE — ED Triage Notes (Signed)
Pt sts itchy rash to arms and legs

## 2017-06-25 NOTE — ED Provider Notes (Signed)
  [place]   [id number] 06/19/17 Arrival Time: 1809  ASSESSMENT & PLAN:  1. Dermatitis     Meds ordered this encounter  Medications  . permethrin (ELIMITE) 5 % cream    Sig: Apply from neck down before bed then wash off in the morning. May repeat in one week.    Dispense:  60 g    Refill:  1  . predniSONE (STERAPRED UNI-PAK 21 TAB) 10 MG (21) TBPK tablet    Sig: Take as directed.    Dispense:  21 tablet    Refill:  0   Question etiology. Will empirically treat for scabies along with a short course of prednisone. She will f/u after completion of meds if not improving. OTC Benadryl if needed.  Reviewed expectations re: course of current medical issues. Questions answered. Outlined signs and symptoms indicating need for more acute intervention. Patient verbalized understanding. After Visit Summary given.   SUBJECTIVE:  Jacqueline Richards is a 46 y.o. female who presents with complaint of an "itchy rash" of her arms and legs. Thinks gradual onset over the past week. No new exposures or irritants. No recent travel. Afebrile. "Just want to scratch my arms and legs." No h/o similar. No OTC treatment. No specific aggravating or alleviating factors reported.  ROS: As per HPI.  OBJECTIVE: Vitals:   06/19/17 1822  BP: 105/65  Pulse: 100  Resp: 18  Temp: 97.8 F (36.6 C)  TempSrc: Oral  SpO2: 98%    General appearance: alert; no distress Lungs: clear to auscultation bilaterally Heart: regular rate and rhythm Extremities: no edema Skin: warm and dry; scattered very small areas of erythema less than 1cm each over extremities; some with excoriations; no sign of infection Psychological: alert and cooperative; normal mood and affect   No Known Allergies  Past Medical History:  Diagnosis Date  . Fibroid    Social History   Socioeconomic History  . Marital status: Single    Spouse name: Not on file  . Number of children: Not on file  . Years of education: Not  on file  . Highest education level: Not on file  Social Needs  . Financial resource strain: Not on file  . Food insecurity - worry: Not on file  . Food insecurity - inability: Not on file  . Transportation needs - medical: Not on file  . Transportation needs - non-medical: Not on file  Occupational History  . Not on file  Tobacco Use  . Smoking status: Never Smoker  . Smokeless tobacco: Never Used  Substance and Sexual Activity  . Alcohol use: Yes  . Drug use: No  . Sexual activity: Yes    Birth control/protection: None  Other Topics Concern  . Not on file  Social History Narrative  . Not on file   Family History  Problem Relation Age of Onset  . Hypertension Mother    Past Surgical History:  Procedure Laterality Date  . CESAREAN SECTION    . TUBAL LIGATION       Vanessa Kick, MD 06/25/17 [phone]

## 2017-10-08 ENCOUNTER — Encounter (HOSPITAL_COMMUNITY): Payer: Self-pay | Admitting: Emergency Medicine

## 2017-10-08 ENCOUNTER — Ambulatory Visit (HOSPITAL_COMMUNITY)
Admission: EM | Admit: 2017-10-08 | Discharge: 2017-10-08 | Disposition: A | Discharge status: Home / Self Care | Payer: Medicaid Other | Attending: Family Medicine | Admitting: Family Medicine

## 2017-10-08 DIAGNOSIS — B86 Scabies: Secondary | ICD-10-CM

## 2017-10-08 DIAGNOSIS — B9789 Other viral agents as the cause of diseases classified elsewhere: Secondary | ICD-10-CM | POA: Diagnosis not present

## 2017-10-08 DIAGNOSIS — J069 Acute upper respiratory infection, unspecified: Secondary | ICD-10-CM | POA: Diagnosis not present

## 2017-10-08 MED ORDER — PERMETHRIN 5 % EX CREA: TOPICAL_CREAM | CUTANEOUS | 0 refills | Status: AC

## 2017-10-08 MED ORDER — CETIRIZINE HCL 10 MG PO CAPS: 10.0000 mg | ORAL_CAPSULE | Freq: Every day | ORAL | 0 refills | Status: AC

## 2017-10-08 MED ORDER — FLUTICASONE PROPIONATE 50 MCG/ACT NA SUSP: 1.0000 | Freq: Every day | NASAL | 0 refills | Status: AC

## 2017-10-08 MED ORDER — ONDANSETRON 4 MG PO TBDP: 4.0000 mg | ORAL_TABLET | Freq: Three times a day (TID) | ORAL | 0 refills | Status: AC | PRN

## 2017-10-08 NOTE — ED Triage Notes (Signed)
Pt sts URI sx and possible ingrown hair; pt sts needs "scabies" cream

## 2017-10-08 NOTE — Discharge Instructions (Addendum)
For congestion please continue to use daily allergy pill, please add in Flonase nasal spray.  Continue Tylenol and ibuprofen for any headaches and fever.  Please use Zofran as needed for nausea and vomiting.  Please continue to try to drink plenty of fluids, slowly transition diet back to normal.  I have refilled the permethrin cream.  Please apply warm compresses to area of concern on your groin.  Please return if not resolving.

## 2017-10-08 NOTE — ED Provider Notes (Signed)
[name]    CSN: [id number] Arrival date & time: 10/08/17  1524     History   Chief Complaint Chief Complaint  Patient presents with  . URI    HPI Jacqueline Richards is a 47 y.o. female presenting today with multiple concerns.  She is concerned about URI symptoms including congestion, cough, fever as well as some associated GI upset with diarrhea.  Symptoms began 2 days ago.  States her daughter had the flu.  Fever of up to 101.  Patient also concerned about scabies.  She is having similar symptoms when she was had it before.  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  She is having itching near her hands as well as scalp.  Requesting prescription for permethrin.  She is also concerned about a possible ingrown hair in her groin.  States that approximately 1 month ago after shaving she developed a bump that ended up popping and leaking of clear fluid.  She has had a small area that has persisted.  Denies any pain, associated itching or burning.  HPI  Past Medical History:  Diagnosis Date  . Fibroid     There are no active problems to display for this patient.   Past Surgical History:  Procedure Laterality Date  . CESAREAN SECTION    . TUBAL LIGATION      OB History    Gravida  7   Para  6   Term      Preterm      AB  1   Living  6     SAB  1   TAB      Ectopic      Multiple      Live Births               Home Medications    Prior to Admission medications   Medication Sig Start Date End Date Taking? Authorizing Provider  Cetirizine HCl 10 MG CAPS Take 1 capsule (10 mg total) by mouth daily for 10 days. 10/08/17 10/18/17  Wieters, Hallie C, PA-C  Fluocinolone Acetonide Body (DERMA-SMOOTHE/FS BODY) 0.01 % OIL Use once daily as directed 08/28/16   Janne Napoleon, NP  fluticasone (FLONASE) 50 MCG/ACT nasal spray Place 1-2 sprays into both nostrils daily for 7 days. 10/08/17 10/15/17  Wieters, Hallie C, PA-C    guaiFENesin-codeine (CHERATUSSIN AC) 100-10 MG/5ML syrup Take 5 mLs every 4 (four) hours as needed by mouth for cough or congestion. 05/21/17   Janne Napoleon, NP  ipratropium (ATROVENT) 0.06 % nasal spray Place 2 sprays into both nostrils 4 (four) times daily. 09/13/16   Lysbeth Penner, FNP  megestrol (MEGACE) 40 MG tablet Take two tablets three times daily for 3 days, then two tablets twice daily for 3 days, then one tablet twice daily 05/14/17   Seabron Spates, CNM  metroNIDAZOLE (METROGEL VAGINAL) 0.75 % vaginal gel Place 1 Applicatorful vaginally 2 (two) times daily. 03/29/17   Robyn Haber, MD  ondansetron (ZOFRAN ODT) 4 MG disintegrating tablet Take 1 tablet (4 mg total) by mouth every 8 (eight) hours as needed for nausea or vomiting. 10/08/17   Wieters, Hallie C, PA-C  pantoprazole (PROTONIX) 20 MG tablet Take 1 tablet (20 mg total) by mouth daily. 11/13/16   Shary Decamp, PA-C  permethrin (ELIMITE) 5 % cream Apply to affected area once 10/08/17   Wieters, Hallie C, PA-C  predniSONE (STERAPRED UNI-PAK 21 TAB) 10 MG (21) TBPK tablet  Take as directed. 06/19/17   Vanessa Kick, MD    Family History Family History  Problem Relation Age of Onset  . Hypertension Mother     Social History Social History   Tobacco Use  . Smoking status: Never Smoker  . Smokeless tobacco: Never Used  Substance Use Topics  . Alcohol use: Yes  . Drug use: No     Allergies   Patient has no known allergies.   Review of Systems Review of Systems  Constitutional: Negative for chills, fatigue and fever.  HENT: Positive for congestion, rhinorrhea and sinus pressure. Negative for ear pain, sore throat and trouble swallowing.   Respiratory: Positive for cough. Negative for chest tightness and shortness of breath.   Cardiovascular: Negative for chest pain.  Gastrointestinal: Positive for diarrhea and nausea. Negative for abdominal pain and vomiting.  Genitourinary: Negative for dysuria, flank pain, genital  sores, hematuria, menstrual problem, vaginal bleeding, vaginal discharge and vaginal pain.  Musculoskeletal: Negative for back pain and myalgias.  Skin: Negative for rash.  Neurological: Positive for headaches. Negative for dizziness and light-headedness.     Physical Exam Triage Vital Signs ED Triage Vitals [10/08/17 1546]  Enc Vitals Group     BP 119/68     Pulse Rate 97     Resp 18     Temp 98.7 F (37.1 C)     Temp Source Oral     SpO2 100 %     Weight      Height      Head Circumference      Peak Flow      Pain Score      Pain Loc      Pain Edu?      Excl. in [place]?    No data found.  Updated Vital Signs BP 119/68 (BP Location: Left Arm)   Pulse 97   Temp 98.7 F (37.1 C) (Oral)   Resp 18   SpO2 100%   Visual Acuity Right Eye Distance:   Left Eye Distance:   Bilateral Distance:    Right Eye Near:   Left Eye Near:    Bilateral Near:     Physical Exam  Constitutional: She appears well-developed and well-nourished. No distress.  HENT:  Head: Normocephalic and atraumatic.  Bilateral TMs nonerythematous, nasal mucosa mildly erythematous, posterior oropharynx nonerythematous, no tonsillar enlargement or exudate.  Eyes: Conjunctivae are normal.  Neck: Neck supple.  Cardiovascular: Normal rate and regular rhythm.  No murmur heard. Pulmonary/Chest: Effort normal and breath sounds normal. No respiratory distress.  Breathing comfortably, CTA BL  Abdominal: Soft. There is no tenderness.  Genitourinary:  Genitourinary Comments: Small 0.5 cm area of raised skin.  No overlying erythema induration or fluctuance.  Musculoskeletal: She exhibits no edema.  Neurological: She is alert.  Skin: Skin is warm and dry.  No obvious rash  Psychiatric: She has a normal mood and affect.  Nursing note and vitals reviewed.    UC Treatments / Results  Labs (all labs ordered are listed, but only abnormal results are displayed) Labs Reviewed - No data to  display  EKG None Radiology No results found.  Procedures Procedures (including critical care time)  Medications Ordered in UC Medications - No data to display   Initial Impression / Assessment and Plan / UC Course  I have reviewed the triage vital signs and the nursing notes.  Pertinent labs & imaging results that were available during my care of the patient were reviewed by me  and considered in my medical decision making (see chart for details).     URI symptoms likely viral versus influenza-like.  Will recommend symptomatic treatment for URI symptoms, vital signs stable, no acute distress.  Will refill permethrin  Advised warm compresses to lesion on groin, does not appear herpes-like at this time.  Possible cyst versus abscess. Discussed strict return precautions. Patient verbalized understanding and is agreeable with plan.   Final Clinical Impressions(s) / UC Diagnoses   Final diagnoses:  Viral URI with cough  Scabies    ED Discharge Orders        Ordered    fluticasone (FLONASE) 50 MCG/ACT nasal spray  Daily     10/08/17 1648    Cetirizine HCl 10 MG CAPS  Daily     10/08/17 1648    permethrin (ELIMITE) 5 % cream     10/08/17 1648    ondansetron (ZOFRAN ODT) 4 MG disintegrating tablet  Every 8 hours PRN     10/08/17 1648       Controlled Substance Prescriptions [place] Controlled Substance Registry consulted? Not Applicable   Janith Lima, [place] 10/08/17 1720

## 2017-10-13 ENCOUNTER — Other Ambulatory Visit: Payer: Self-pay

## 2017-10-13 ENCOUNTER — Encounter (HOSPITAL_COMMUNITY): Payer: Self-pay | Admitting: Emergency Medicine

## 2017-10-13 ENCOUNTER — Ambulatory Visit (HOSPITAL_COMMUNITY)
Admission: EM | Admit: 2017-10-13 | Discharge: 2017-10-13 | Disposition: A | Discharge status: Home / Self Care | Payer: Medicaid Other | Attending: Family Medicine | Admitting: Family Medicine

## 2017-10-13 DIAGNOSIS — Z8249 Family history of ischemic heart disease and other diseases of the circulatory system: Secondary | ICD-10-CM | POA: Diagnosis not present

## 2017-10-13 DIAGNOSIS — L089 Local infection of the skin and subcutaneous tissue, unspecified: Secondary | ICD-10-CM | POA: Diagnosis present

## 2017-10-13 DIAGNOSIS — L739 Follicular disorder, unspecified: Secondary | ICD-10-CM | POA: Diagnosis not present

## 2017-10-13 NOTE — Discharge Instructions (Signed)
Continue with warm compresses to the area as it continues to heal. Will call you if your herpes test returns positive.

## 2017-10-13 NOTE — ED Provider Notes (Signed)
[facility]    CSN: [id number] Arrival date & time: 10/13/17  1238     History   Chief Complaint Chief Complaint  Patient presents with  . Recurrent Skin Infections    HPI Jacqueline Richards is a 47 y.o. female.   [name] presents with a concern about a "hair bump" to her groin/pubic region which has been present for approximately 2 months, started draining a few days ago and is now feeling better. Occurred after shaving her pubic hair. Was larger and painful. Now feels improved. States she is concerned about herpes as well as MRSA as she is considering being sexually active again and would like to be safe. States hasn't been sexually active in the past year. Denies any previous similar. No fevers. 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    ROS per HPI.      Past Medical History:  Diagnosis Date  . Fibroid     There are no active problems to display for this patient.   Past Surgical History:  Procedure Laterality Date  . CESAREAN SECTION    . TUBAL LIGATION      OB History    Gravida  7   Para  6   Term      Preterm      AB  1   Living  6     SAB  1   TAB      Ectopic      Multiple      Live Births               Home Medications    Prior to Admission medications   Medication Sig Start Date End Date Taking? Authorizing Provider  Cetirizine HCl 10 MG CAPS Take 1 capsule (10 mg total) by mouth daily for 10 days. 10/08/17 10/18/17  Wieters, Hallie C, PA-C  Fluocinolone Acetonide Body (DERMA-SMOOTHE/FS BODY) 0.01 % OIL Use once daily as directed 08/28/16   Janne Napoleon, NP  fluticasone (FLONASE) 50 MCG/ACT nasal spray Place 1-2 sprays into both nostrils daily for 7 days. 10/08/17 10/15/17  Wieters, Hallie C, PA-C  guaiFENesin-codeine (CHERATUSSIN AC) 100-10 MG/5ML syrup Take 5 mLs every 4 (four) hours as needed by mouth for cough or congestion. 05/21/17   Janne Napoleon, NP  ipratropium (ATROVENT) 0.06  % nasal spray Place 2 sprays into both nostrils 4 (four) times daily. 09/13/16   Lysbeth Penner, FNP  megestrol (MEGACE) 40 MG tablet Take two tablets three times daily for 3 days, then two tablets twice daily for 3 days, then one tablet twice daily 05/14/17   Seabron Spates, CNM  metroNIDAZOLE (METROGEL VAGINAL) 0.75 % vaginal gel Place 1 Applicatorful vaginally 2 (two) times daily. 03/29/17   Robyn Haber, MD  ondansetron (ZOFRAN ODT) 4 MG disintegrating tablet Take 1 tablet (4 mg total) by mouth every 8 (eight) hours as needed for nausea or vomiting. 10/08/17   Wieters, Hallie C, PA-C  pantoprazole (PROTONIX) 20 MG tablet Take 1 tablet (20 mg total) by mouth daily. 11/13/16   Shary Decamp, PA-C  permethrin (ELIMITE) 5 % cream Apply to affected area once 10/08/17   Wieters, Hallie C, PA-C  predniSONE (STERAPRED UNI-PAK 21 TAB) 10 MG (21) TBPK tablet Take as directed. 06/19/17   Vanessa Kick, MD    Family History Family History  Problem Relation Age of Onset  . Hypertension Mother     Social History Social History  Tobacco Use  . Smoking status: Never Smoker  . Smokeless tobacco: Never Used  Substance Use Topics  . Alcohol use: Yes  . Drug use: No     Allergies   Patient has no known allergies.   Review of Systems Review of Systems   Physical Exam Triage Vital Signs ED Triage Vitals  Enc Vitals Group     BP 10/13/17 1257 123/81     Pulse Rate 10/13/17 1257 72     Resp --      Temp 10/13/17 1257 98.3 F (36.8 C)     Temp Source 10/13/17 1257 Oral     SpO2 10/13/17 1257 100 %     Weight --      Height --      Head Circumference --      Peak Flow --      Pain Score 10/13/17 1255 3     Pain Loc --      Pain Edu? --      Excl. in [place]? --    No data found.  Updated Vital Signs BP 123/81 (BP Location: Left Arm)   Pulse 72   Temp 98.3 F (36.8 C) (Oral)   LMP 09/26/2017   SpO2 100%   Visual Acuity Right Eye Distance:   Left Eye Distance:   Bilateral  Distance:    Right Eye Near:   Left Eye Near:    Bilateral Near:     Physical Exam  Constitutional: She is oriented to person, place, and time. She appears well-developed and well-nourished. No distress.  Cardiovascular: Normal rate, regular rhythm and normal heart sounds.  Pulmonary/Chest: Effort normal and breath sounds normal.  Genitourinary:     Genitourinary Comments: Flat skin toned lesion with pinpoint opening at base of hair follicle; without active drainage; non tender; without redness; without raised edges or flaking  Neurological: She is alert and oriented to person, place, and time.  Skin: Skin is warm and dry.     UC Treatments / Results  Labs (all labs ordered are listed, but only abnormal results are displayed) Labs Reviewed  HSV CULTURE AND TYPING    EKG None Radiology No results found.  Procedures Procedures (including critical care time)  Medications Ordered in UC Medications - No data to display   Initial Impression / Assessment and Plan / UC Course  I have reviewed the triage vital signs and the nursing notes.  Pertinent labs & imaging results that were available during my care of the patient were reviewed by me and considered in my medical decision making (see chart for details).     Appears consistent with healing folliculitis/ingrown hair. Without active drainage. Patient insistent to have herpes tested, swabbed lesion at this time. Will notify of any positive findings and if any changes to treatment are needed.  Encouraged continued warm compresses to the area to further promote healing. Patient verbalized understanding and agreeable to plan.    Final Clinical Impressions(s) / UC Diagnoses   Final diagnoses:  Folliculitis    ED Discharge Orders    None       Controlled Substance Prescriptions [place] Controlled Substance Registry consulted? Not Applicable   Zigmund Gottron, NP 10/13/17 1336

## 2017-10-13 NOTE — ED Triage Notes (Signed)
C.o boil in creases near vagina onset over a month

## 2017-10-15 LAB — HSV CULTURE AND TYPING

## 2017-10-16 ENCOUNTER — Telehealth (HOSPITAL_COMMUNITY): Payer: Self-pay

## 2017-10-16 NOTE — Telephone Encounter (Signed)
Pt called and informed of negative test result.

## 2017-10-19 ENCOUNTER — Encounter (HOSPITAL_COMMUNITY): Payer: Self-pay | Admitting: *Deleted

## 2017-10-19 ENCOUNTER — Other Ambulatory Visit: Payer: Self-pay

## 2017-10-19 ENCOUNTER — Ambulatory Visit (HOSPITAL_COMMUNITY)
Admission: EM | Admit: 2017-10-19 | Discharge: 2017-10-19 | Disposition: A | Discharge status: Home / Self Care | Payer: Medicaid Other | Attending: Internal Medicine | Admitting: Internal Medicine

## 2017-10-19 DIAGNOSIS — H1031 Unspecified acute conjunctivitis, right eye: Secondary | ICD-10-CM

## 2017-10-19 MED ORDER — POLYMYXIN B-TRIMETHOPRIM 10000-0.1 UNIT/ML-% OP SOLN: 1.0000 [drp] | OPHTHALMIC | 0 refills | Status: AC

## 2017-10-19 NOTE — ED Notes (Signed)
Not in waiting area when called

## 2017-10-19 NOTE — ED Triage Notes (Signed)
Reports right eye pruritis, tearing, irritation x 2 days without vision changes.

## 2017-10-19 NOTE — ED Provider Notes (Signed)
[facility]    CSN: [id number] Arrival date & time: 10/19/17  1253     History   Chief Complaint Chief Complaint  Patient presents with  . Eye Problem    HPI Jacqueline Richards is a 47 y.o. female.   Jacqueline Richards presents with complaints of tearing, itching and redness to right eye which has been ongoing for a few days. States she is taking daily allergy medication and nasal spray which has not helped. States her vision feels mildly blurred. She states she had mattering this morning. Does have some discomfort but without specific pain to eye ball. States she has been exposed to pink eye where she works. Denies uri symptoms. Denies other vision changes or loss. Does not wear glasses or contacts. Without injury or known exposure to eye.    ROS per HPI.      Past Medical History:  Diagnosis Date  . Fibroid     There are no active problems to display for this patient.   Past Surgical History:  Procedure Laterality Date  . CESAREAN SECTION    . TUBAL LIGATION      OB History    Gravida  7   Para  6   Term      Preterm      AB  1   Living  6     SAB  1   TAB      Ectopic      Multiple      Live Births               Home Medications    Prior to Admission medications   Medication Sig Start Date End Date Taking? Authorizing Provider  Cetirizine HCl 10 MG CAPS Take 1 capsule (10 mg total) by mouth daily for 10 days. 10/08/17 10/19/17 Yes Wieters, Hallie C, PA-C  fluticasone (FLONASE) 50 MCG/ACT nasal spray Place 1-2 sprays into both nostrils daily for 7 days. 10/08/17 10/19/17 Yes Wieters, Hallie C, PA-C  Fluocinolone Acetonide Body (DERMA-SMOOTHE/FS BODY) 0.01 % OIL Use once daily as directed 08/28/16   Janne Napoleon, NP  guaiFENesin-codeine (CHERATUSSIN AC) 100-10 MG/5ML syrup Take 5 mLs every 4 (four) hours as needed by mouth for cough or congestion. 05/21/17   Janne Napoleon, NP  ipratropium (ATROVENT) 0.06 % nasal spray Place 2 sprays into both  nostrils 4 (four) times daily. 09/13/16   Lysbeth Penner, FNP  megestrol (MEGACE) 40 MG tablet Take two tablets three times daily for 3 days, then two tablets twice daily for 3 days, then one tablet twice daily 05/14/17   Seabron Spates, CNM  metroNIDAZOLE (METROGEL VAGINAL) 0.75 % vaginal gel Place 1 Applicatorful vaginally 2 (two) times daily. 03/29/17   Robyn Haber, MD  ondansetron (ZOFRAN ODT) 4 MG disintegrating tablet Take 1 tablet (4 mg total) by mouth every 8 (eight) hours as needed for nausea or vomiting. 10/08/17   Wieters, Hallie C, PA-C  pantoprazole (PROTONIX) 20 MG tablet Take 1 tablet (20 mg total) by mouth daily. 11/13/16   Shary Decamp, PA-C  permethrin (ELIMITE) 5 % cream Apply to affected area once 10/08/17   Wieters, Hallie C, PA-C  predniSONE (STERAPRED UNI-PAK 21 TAB) 10 MG (21) TBPK tablet Take as directed. 06/19/17   Vanessa Kick, MD  trimethoprim-polymyxin b (POLYTRIM) ophthalmic solution Place 1 drop into the right eye every 4 (four) hours for 5 days. 10/19/17 10/24/17  Zigmund Gottron, NP    Family History Family History  Problem Relation Age of Onset  . Hypertension Mother     Social History Social History   Tobacco Use  . Smoking status: Never Smoker  . Smokeless tobacco: Never Used  Substance Use Topics  . Alcohol use: Yes    Comment: occasionally  . Drug use: No     Allergies   Patient has no known allergies.   Review of Systems Review of Systems   Physical Exam Triage Vital Signs ED Triage Vitals [10/19/17 1436]  Enc Vitals Group     BP 113/63     Pulse Rate 94     Resp 16     Temp 98.1 F (36.7 C)     Temp Source Oral     SpO2 100 %     Weight      Height      Head Circumference      Peak Flow      Pain Score 8     Pain Loc      Pain Edu?      Excl. in [place]?    No data found.  Updated Vital Signs BP 113/63   Pulse 94   Temp 98.1 F (36.7 C) (Oral)   Resp 16   LMP 09/26/2017   SpO2 100%   Visual Acuity Right Eye  Distance: 20/30 Left Eye Distance: 20/20 Bilateral Distance: 20/20  Right Eye Near:   Left Eye Near:    Bilateral Near:     Physical Exam  Constitutional: She is oriented to person, place, and time. She appears well-developed and well-nourished. No distress.  Eyes: Pupils are equal, round, and reactive to light. Conjunctivae, EOM and lids are normal. Right eye exhibits no discharge, no exudate and no hordeolum. Left eye exhibits no discharge, no exudate and no hordeolum.  Without significant redness or discharge noted   Cardiovascular: Normal rate, regular rhythm and normal heart sounds.  Pulmonary/Chest: Effort normal and breath sounds normal.  Neurological: She is alert and oriented to person, place, and time.  Skin: Skin is warm and dry.     UC Treatments / Results  Labs (all labs ordered are listed, but only abnormal results are displayed) Labs Reviewed - No data to display  EKG None Radiology No results found.  Procedures Procedures (including critical care time)  Medications Ordered in UC Medications - No data to display   Initial Impression / Assessment and Plan / UC Course  I have reviewed the triage vital signs and the nursing notes.  Pertinent labs & imaging results that were available during my care of the patient were reviewed by me and considered in my medical decision making (see chart for details).     Minimal findings on exam. Patient is very concerned about pink eye exposure and feels this is similar to when she had pink eye in the past. Poly trim provided at this time. To continue with allergy medication as she is already taking as allergies may likely be also contributing. Return precautions provided with ophthalmology resources provided. Patient verbalized understanding and agreeable to plan.    Final Clinical Impressions(s) / UC Diagnoses   Final diagnoses:  Acute conjunctivitis of right eye, unspecified acute conjunctivitis type    ED Discharge  Orders        Ordered    trimethoprim-polymyxin b (POLYTRIM) ophthalmic solution  Every 4 hours     10/19/17 1505       Controlled Substance Prescriptions [place] Controlled Substance Registry consulted? Not Applicable  Zigmund Gottron, NP 10/19/17 1511

## 2018-05-04 IMAGING — US US TRANSVAGINAL NON-OB
1 series · 15 of 25 positions shown · non-contrast
Comparison: None

CLINICAL DATA: Heavy vaginal bleeding.  Fibroids.

EXAM:
TRANSABDOMINAL AND TRANSVAGINAL ULTRASOUND OF PELVIS
TECHNIQUE: Both transabdominal and transvaginal ultrasound examinations of the
pelvis were performed. Transabdominal technique was performed for
global imaging of the pelvis including uterus, ovaries, adnexal
regions, and pelvic cul-de-sac. It was necessary to proceed with
endovaginal exam following the transabdominal exam to visualize the
uterus, endometrium, ovaries and adnexa .

[Series 1: us transvaginal non-ob · 15 of 55 slices shown]
[im 1/55]
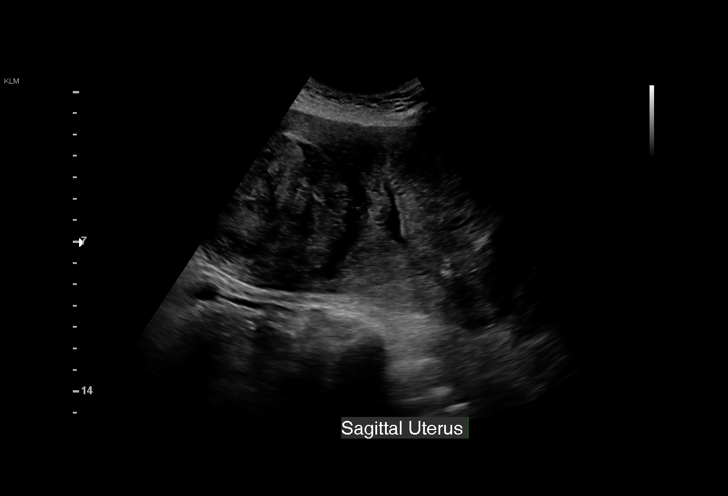
[im 5/55]
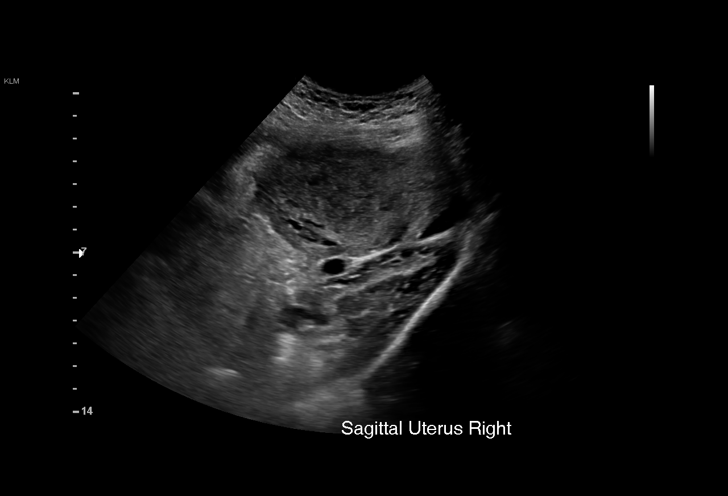
[im 10/55]
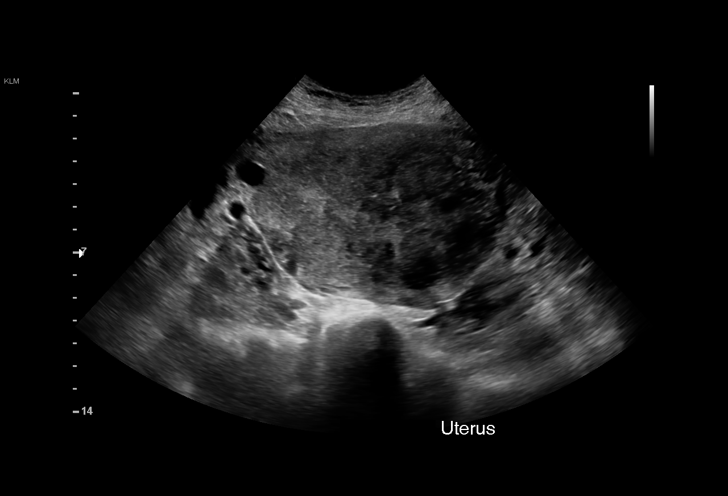
[im 12/55]
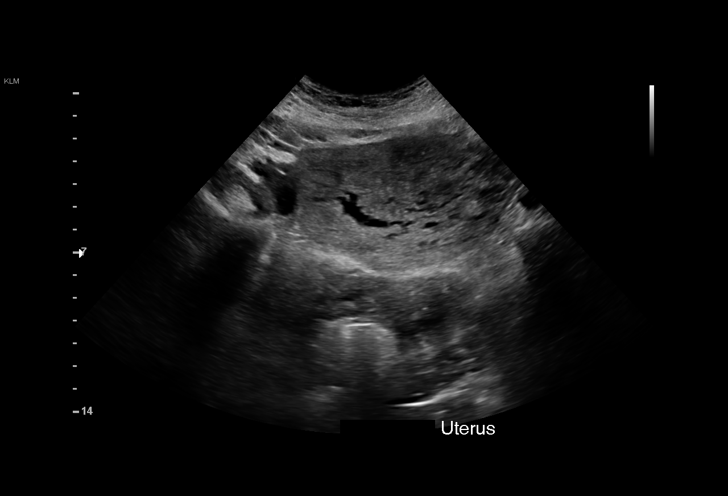
[im 16/55]
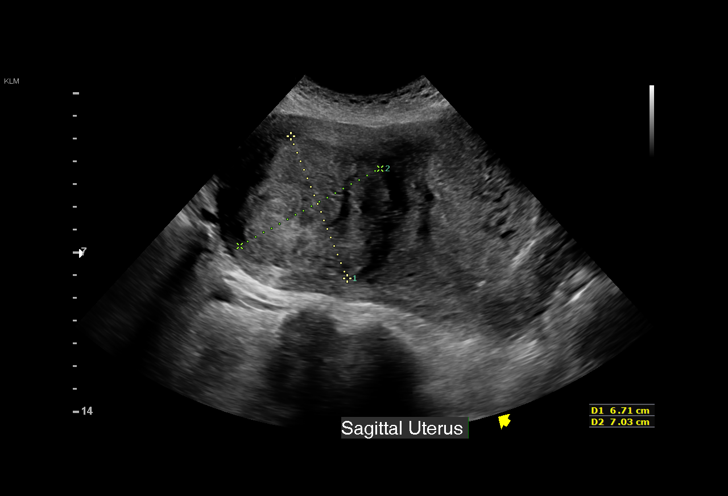
[im 21/55]
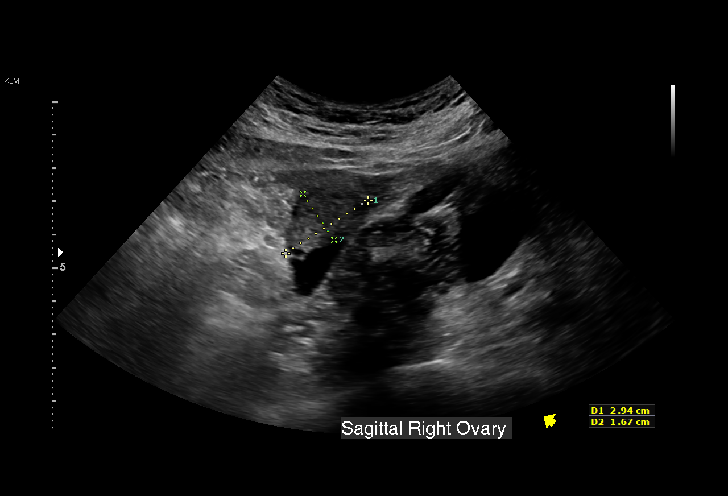
[im 23/55]
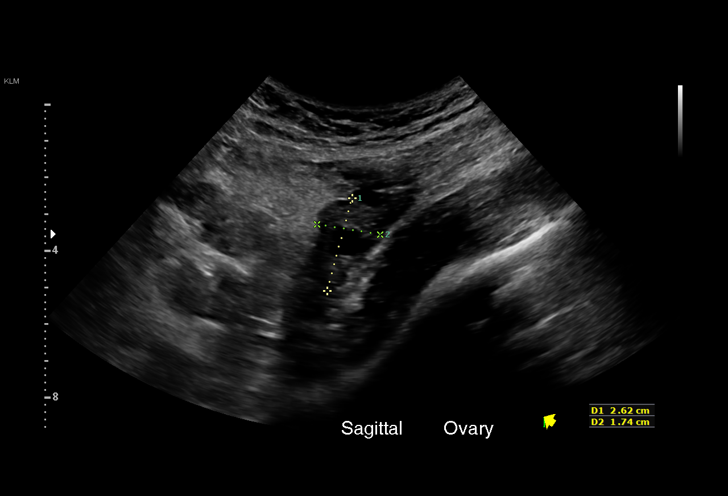
[im 28/55]
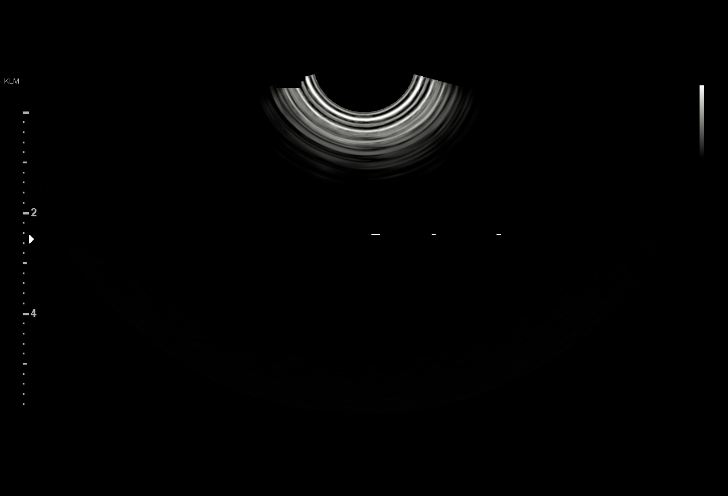
[im 32/55]
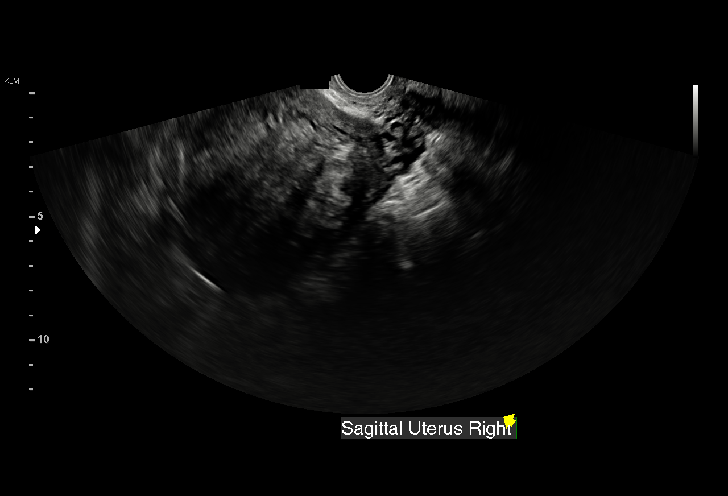
[im 34/55]
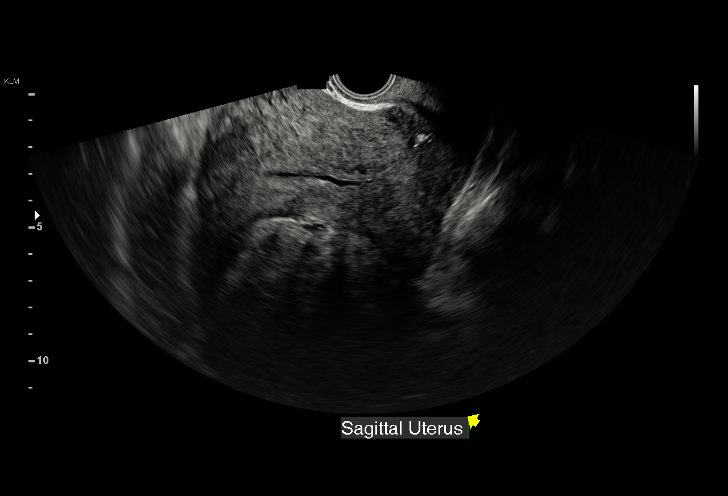
[im 39/55]
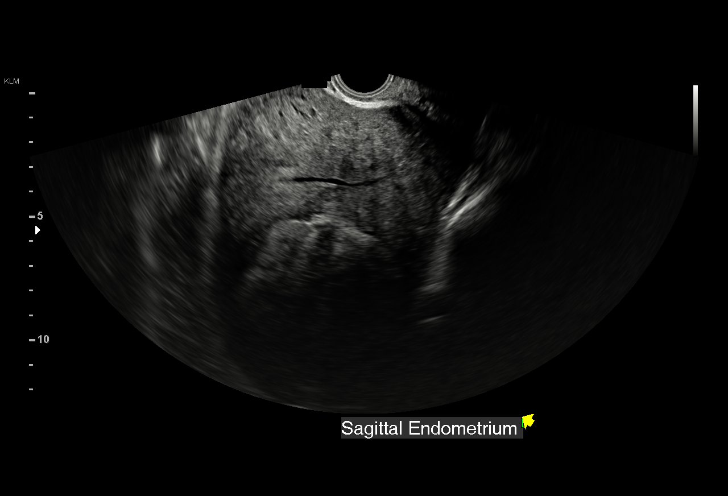
[im 43/55]
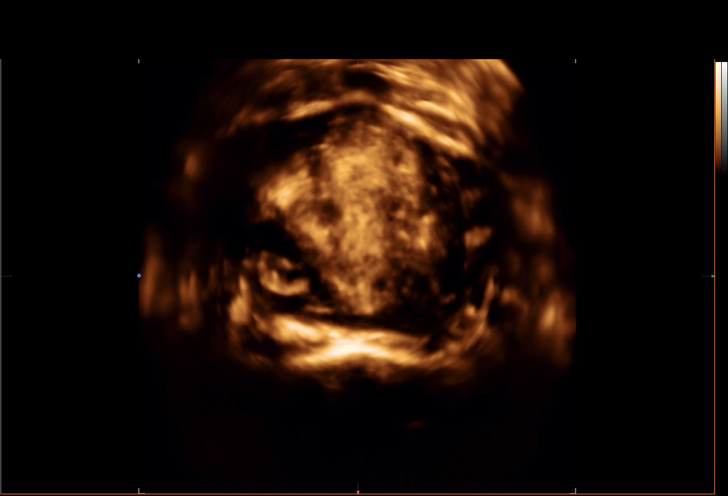
[im 46/55]
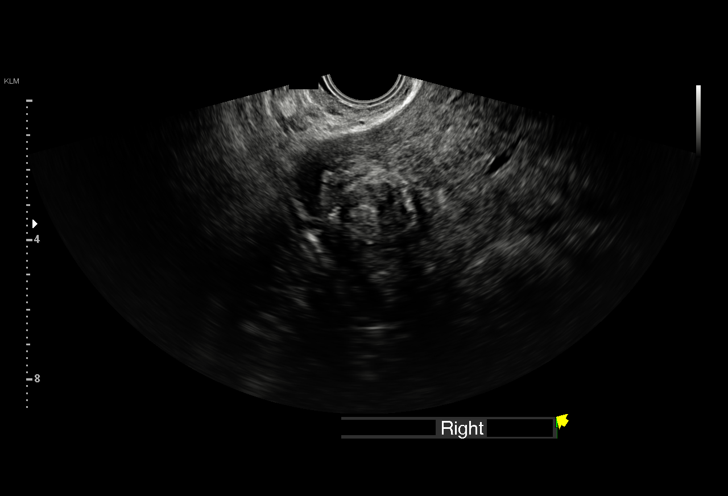
[im 50/55]
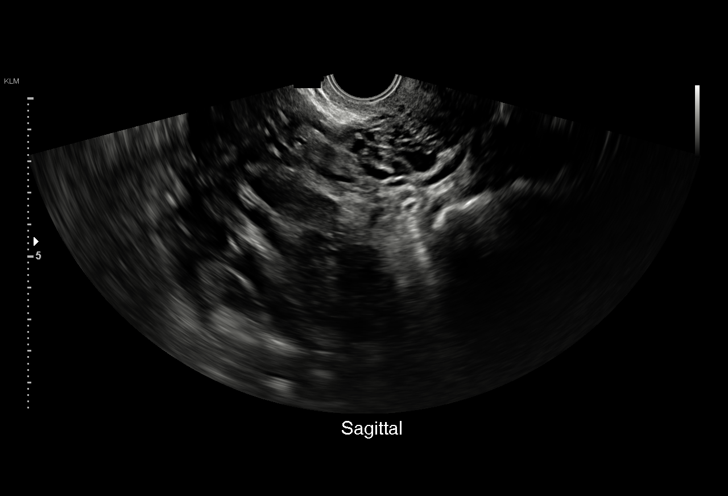
[im 55/55]
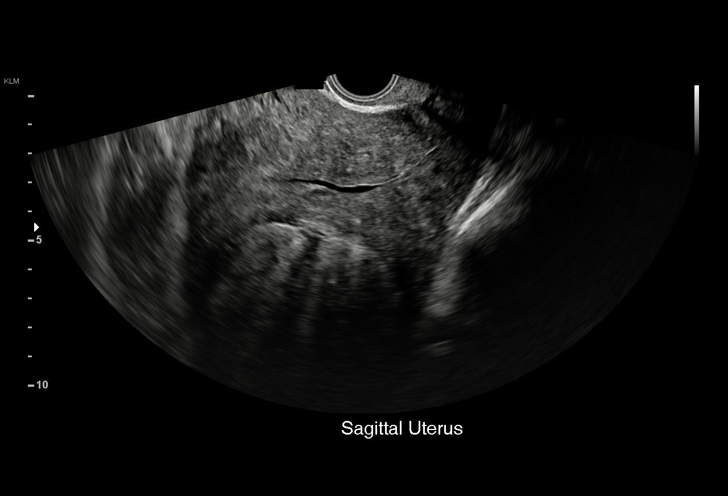

[15 of 25 positions shown; findings below may reference images not displayed]

FINDINGS: Uterus

Measurements: 11.4 x 11.9 x 2.0 cm. Large central fibroid measures
up to 7.1 cm. Adjacent smaller right fundal fibroid measures up to
3.5 cm.

Endometrium

Thickness: Difficult to visualize due to the Central large fibroid,
3 mm in thickness where visualized.. No focal abnormality
visualized.

Right ovary

Measurements: 2.9 x 1.7 x 1.9 cm. Normal appearance/no adnexal mass.

Left ovary

Measurements: 2.6 x 1.7 x 2.4 cm. Normal appearance/no adnexal mass.

Other findings

No abnormal free fluid.
IMPRESSION: Enlarged fibroid uterus as above.  No acute findings.
